# Patient Record
Sex: Female | Born: 1955 | Race: White | Hispanic: No | Marital: Married | Smoking: Never smoker
Health system: Southern US, Community
[De-identification: ages and names within clinical notes are randomized; demographics above are authoritative.]

## PROBLEM LIST (undated history)

## (undated) DIAGNOSIS — Z86718 Personal history of other venous thrombosis and embolism: Secondary | ICD-10-CM

## (undated) HISTORY — PX: GASTRIC BYPASS: SHX52

## (undated) HISTORY — DX: Personal history of other venous thrombosis and embolism: Z86.718

## (undated) HISTORY — PX: ABDOMINAL HYSTERECTOMY: SHX81

## (undated) HISTORY — PX: TONSILLECTOMY: SHX5217

## (undated) HISTORY — PX: HAND TENDON SURGERY: SHX663

## (undated) HISTORY — PX: TUBAL LIGATION: SHX77

---

## 1997-09-06 ENCOUNTER — Emergency Department (HOSPITAL_COMMUNITY): Admission: EM | Admit: 1997-09-06 | Discharge: 1997-09-06 | Payer: Self-pay | Admitting: Emergency Medicine

## 1999-05-21 ENCOUNTER — Ambulatory Visit (HOSPITAL_COMMUNITY): Admission: RE | Admit: 1999-05-21 | Discharge: 1999-05-21 | Payer: Self-pay | Admitting: *Deleted

## 1999-06-29 ENCOUNTER — Ambulatory Visit (HOSPITAL_COMMUNITY): Admission: RE | Admit: 1999-06-29 | Discharge: 1999-06-29 | Payer: Self-pay | Admitting: *Deleted

## 1999-06-29 ENCOUNTER — Encounter (INDEPENDENT_AMBULATORY_CARE_PROVIDER_SITE_OTHER): Payer: Self-pay | Admitting: Specialist

## 1999-07-02 ENCOUNTER — Encounter: Payer: Self-pay | Admitting: *Deleted

## 1999-07-02 ENCOUNTER — Ambulatory Visit (HOSPITAL_COMMUNITY): Admission: RE | Admit: 1999-07-02 | Discharge: 1999-07-02 | Payer: Self-pay | Admitting: *Deleted

## 1999-07-09 ENCOUNTER — Encounter: Payer: Self-pay | Admitting: *Deleted

## 1999-07-09 ENCOUNTER — Ambulatory Visit (HOSPITAL_COMMUNITY): Admission: RE | Admit: 1999-07-09 | Discharge: 1999-07-09 | Payer: Self-pay | Admitting: *Deleted

## 1999-08-16 ENCOUNTER — Encounter: Payer: Self-pay | Admitting: General Surgery

## 1999-08-21 ENCOUNTER — Encounter (INDEPENDENT_AMBULATORY_CARE_PROVIDER_SITE_OTHER): Payer: Self-pay

## 1999-08-21 ENCOUNTER — Observation Stay (HOSPITAL_COMMUNITY): Admission: RE | Admit: 1999-08-21 | Discharge: 1999-08-22 | Payer: Self-pay | Admitting: General Surgery

## 1999-08-23 ENCOUNTER — Emergency Department (HOSPITAL_COMMUNITY): Admission: EM | Admit: 1999-08-23 | Discharge: 1999-08-23 | Payer: Self-pay | Admitting: Emergency Medicine

## 1999-08-23 ENCOUNTER — Encounter: Payer: Self-pay | Admitting: Emergency Medicine

## 1999-09-02 ENCOUNTER — Emergency Department (HOSPITAL_COMMUNITY): Admission: EM | Admit: 1999-09-02 | Discharge: 1999-09-02 | Payer: Self-pay | Admitting: Emergency Medicine

## 1999-09-02 ENCOUNTER — Encounter: Payer: Self-pay | Admitting: Emergency Medicine

## 1999-09-18 ENCOUNTER — Encounter: Payer: Self-pay | Admitting: Emergency Medicine

## 1999-09-18 ENCOUNTER — Emergency Department (HOSPITAL_COMMUNITY): Admission: EM | Admit: 1999-09-18 | Discharge: 1999-09-18 | Payer: Self-pay | Admitting: Emergency Medicine

## 1999-09-27 ENCOUNTER — Encounter: Payer: Self-pay | Admitting: Internal Medicine

## 1999-09-27 ENCOUNTER — Inpatient Hospital Stay (HOSPITAL_COMMUNITY): Admission: EM | Admit: 1999-09-27 | Discharge: 1999-10-05 | Payer: Self-pay | Admitting: Internal Medicine

## 1999-09-29 ENCOUNTER — Encounter: Payer: Self-pay | Admitting: Internal Medicine

## 1999-10-01 ENCOUNTER — Encounter (HOSPITAL_BASED_OUTPATIENT_CLINIC_OR_DEPARTMENT_OTHER): Payer: Self-pay | Admitting: Internal Medicine

## 1999-10-25 ENCOUNTER — Encounter (INDEPENDENT_AMBULATORY_CARE_PROVIDER_SITE_OTHER): Payer: Self-pay | Admitting: Specialist

## 1999-10-25 ENCOUNTER — Ambulatory Visit: Admission: RE | Admit: 1999-10-25 | Discharge: 1999-10-25 | Payer: Self-pay | Admitting: Critical Care Medicine

## 1999-10-29 ENCOUNTER — Encounter: Payer: Self-pay | Admitting: Critical Care Medicine

## 1999-10-29 ENCOUNTER — Ambulatory Visit (HOSPITAL_COMMUNITY): Admission: RE | Admit: 1999-10-29 | Discharge: 1999-10-29 | Payer: Self-pay | Admitting: Critical Care Medicine

## 1999-12-07 ENCOUNTER — Ambulatory Visit: Admission: RE | Admit: 1999-12-07 | Discharge: 1999-12-07 | Payer: Self-pay | Admitting: Internal Medicine

## 1999-12-26 ENCOUNTER — Encounter: Payer: Self-pay | Admitting: Internal Medicine

## 1999-12-26 ENCOUNTER — Inpatient Hospital Stay (HOSPITAL_COMMUNITY): Admission: AD | Admit: 1999-12-26 | Discharge: 2000-01-01 | Payer: Self-pay | Admitting: Internal Medicine

## 2000-01-22 ENCOUNTER — Emergency Department (HOSPITAL_COMMUNITY): Admission: EM | Admit: 2000-01-22 | Discharge: 2000-01-22 | Payer: Self-pay | Admitting: Emergency Medicine

## 2000-02-13 ENCOUNTER — Encounter: Payer: Self-pay | Admitting: Emergency Medicine

## 2000-02-13 ENCOUNTER — Emergency Department (HOSPITAL_COMMUNITY): Admission: EM | Admit: 2000-02-13 | Discharge: 2000-02-13 | Payer: Self-pay | Admitting: Emergency Medicine

## 2000-07-01 ENCOUNTER — Encounter: Admission: RE | Admit: 2000-07-01 | Discharge: 2000-07-01 | Payer: Self-pay | Admitting: Internal Medicine

## 2000-07-01 ENCOUNTER — Encounter: Payer: Self-pay | Admitting: Internal Medicine

## 2000-08-26 ENCOUNTER — Emergency Department (HOSPITAL_COMMUNITY): Admission: EM | Admit: 2000-08-26 | Discharge: 2000-08-26 | Payer: Self-pay | Admitting: Emergency Medicine

## 2000-08-26 ENCOUNTER — Encounter: Payer: Self-pay | Admitting: Emergency Medicine

## 2001-04-30 ENCOUNTER — Emergency Department (HOSPITAL_COMMUNITY): Admission: EM | Admit: 2001-04-30 | Discharge: 2001-05-01 | Payer: Self-pay | Admitting: Emergency Medicine

## 2001-05-01 ENCOUNTER — Ambulatory Visit: Admission: RE | Admit: 2001-05-01 | Discharge: 2001-05-01 | Payer: Self-pay | Admitting: *Deleted

## 2001-05-01 ENCOUNTER — Encounter: Payer: Self-pay | Admitting: Emergency Medicine

## 2001-05-16 ENCOUNTER — Emergency Department (HOSPITAL_COMMUNITY): Admission: EM | Admit: 2001-05-16 | Discharge: 2001-05-16 | Payer: Self-pay | Admitting: Emergency Medicine

## 2002-01-24 ENCOUNTER — Encounter: Payer: Self-pay | Admitting: Emergency Medicine

## 2002-01-24 ENCOUNTER — Emergency Department (HOSPITAL_COMMUNITY): Admission: AC | Admit: 2002-01-24 | Discharge: 2002-01-24 | Payer: Self-pay

## 2002-07-02 ENCOUNTER — Encounter: Payer: Self-pay | Admitting: Emergency Medicine

## 2002-07-02 ENCOUNTER — Emergency Department (HOSPITAL_COMMUNITY): Admission: EM | Admit: 2002-07-02 | Discharge: 2002-07-03 | Payer: Self-pay | Admitting: Emergency Medicine

## 2002-07-02 ENCOUNTER — Emergency Department (HOSPITAL_COMMUNITY): Admission: EM | Admit: 2002-07-02 | Discharge: 2002-07-02 | Payer: Self-pay | Admitting: Emergency Medicine

## 2002-07-03 ENCOUNTER — Encounter: Payer: Self-pay | Admitting: Emergency Medicine

## 2003-06-12 ENCOUNTER — Emergency Department (HOSPITAL_COMMUNITY): Admission: EM | Admit: 2003-06-12 | Discharge: 2003-06-13 | Payer: Self-pay | Admitting: Emergency Medicine

## 2004-04-27 ENCOUNTER — Ambulatory Visit (HOSPITAL_COMMUNITY): Admission: RE | Admit: 2004-04-27 | Discharge: 2004-04-27 | Payer: Self-pay

## 2004-05-02 ENCOUNTER — Ambulatory Visit: Payer: Self-pay | Admitting: Internal Medicine

## 2004-05-09 ENCOUNTER — Ambulatory Visit: Payer: Self-pay | Admitting: Internal Medicine

## 2004-05-17 ENCOUNTER — Ambulatory Visit: Payer: Self-pay | Admitting: Internal Medicine

## 2004-10-19 ENCOUNTER — Encounter: Admission: RE | Admit: 2004-10-19 | Discharge: 2004-10-19 | Payer: Self-pay | Admitting: Internal Medicine

## 2005-03-28 ENCOUNTER — Emergency Department (HOSPITAL_COMMUNITY): Admission: EM | Admit: 2005-03-28 | Discharge: 2005-03-28 | Payer: Self-pay | Admitting: Nurse Practitioner

## 2005-03-29 ENCOUNTER — Ambulatory Visit (HOSPITAL_COMMUNITY): Admission: RE | Admit: 2005-03-29 | Discharge: 2005-03-29 | Payer: Self-pay | Admitting: Internal Medicine

## 2005-04-08 ENCOUNTER — Encounter: Admission: RE | Admit: 2005-04-08 | Discharge: 2005-04-08 | Payer: Self-pay | Admitting: Internal Medicine

## 2005-04-25 ENCOUNTER — Encounter: Admission: RE | Admit: 2005-04-25 | Discharge: 2005-04-25 | Payer: Self-pay | Admitting: Internal Medicine

## 2005-05-05 ENCOUNTER — Emergency Department (HOSPITAL_COMMUNITY): Admission: EM | Admit: 2005-05-05 | Discharge: 2005-05-05 | Payer: Self-pay | Admitting: Family Medicine

## 2005-05-15 ENCOUNTER — Encounter: Admission: RE | Admit: 2005-05-15 | Discharge: 2005-06-05 | Payer: Self-pay | Admitting: Internal Medicine

## 2005-08-10 ENCOUNTER — Encounter: Admission: RE | Admit: 2005-08-10 | Discharge: 2005-08-10 | Payer: Self-pay | Admitting: Orthopedic Surgery

## 2005-12-06 ENCOUNTER — Emergency Department (HOSPITAL_COMMUNITY): Admission: EM | Admit: 2005-12-06 | Discharge: 2005-12-06 | Payer: Self-pay | Admitting: Emergency Medicine

## 2006-01-23 ENCOUNTER — Emergency Department (HOSPITAL_COMMUNITY): Admission: EM | Admit: 2006-01-23 | Discharge: 2006-01-23 | Payer: Self-pay | Admitting: Emergency Medicine

## 2006-01-23 ENCOUNTER — Emergency Department (HOSPITAL_COMMUNITY): Admission: EM | Admit: 2006-01-23 | Discharge: 2006-01-24 | Payer: Self-pay | Admitting: Emergency Medicine

## 2006-01-29 ENCOUNTER — Encounter: Admission: RE | Admit: 2006-01-29 | Discharge: 2006-01-29 | Payer: Self-pay | Admitting: Internal Medicine

## 2006-05-09 ENCOUNTER — Encounter: Admission: RE | Admit: 2006-05-09 | Discharge: 2006-05-09 | Payer: Self-pay | Admitting: Internal Medicine

## 2006-05-16 ENCOUNTER — Emergency Department (HOSPITAL_COMMUNITY): Admission: EM | Admit: 2006-05-16 | Discharge: 2006-05-17 | Payer: Self-pay | Admitting: Emergency Medicine

## 2006-05-23 ENCOUNTER — Emergency Department (HOSPITAL_COMMUNITY): Admission: EM | Admit: 2006-05-23 | Discharge: 2006-05-23 | Payer: Self-pay | Admitting: Emergency Medicine

## 2006-05-30 ENCOUNTER — Encounter: Admission: RE | Admit: 2006-05-30 | Discharge: 2006-05-30 | Payer: Self-pay | Admitting: Internal Medicine

## 2006-06-20 ENCOUNTER — Other Ambulatory Visit: Admission: RE | Admit: 2006-06-20 | Discharge: 2006-06-20 | Payer: Self-pay | Admitting: Interventional Radiology

## 2006-06-20 ENCOUNTER — Encounter (INDEPENDENT_AMBULATORY_CARE_PROVIDER_SITE_OTHER): Payer: Self-pay | Admitting: Physician Assistant

## 2006-06-20 ENCOUNTER — Encounter: Admission: RE | Admit: 2006-06-20 | Discharge: 2006-06-20 | Payer: Self-pay | Admitting: Internal Medicine

## 2006-11-17 ENCOUNTER — Emergency Department (HOSPITAL_COMMUNITY): Admission: EM | Admit: 2006-11-17 | Discharge: 2006-11-18 | Payer: Self-pay | Admitting: Emergency Medicine

## 2007-03-12 ENCOUNTER — Emergency Department (HOSPITAL_COMMUNITY): Admission: EM | Admit: 2007-03-12 | Discharge: 2007-03-13 | Payer: Self-pay | Admitting: Emergency Medicine

## 2007-08-09 ENCOUNTER — Emergency Department (HOSPITAL_COMMUNITY): Admission: EM | Admit: 2007-08-09 | Discharge: 2007-08-09 | Payer: Self-pay | Admitting: Emergency Medicine

## 2007-08-18 ENCOUNTER — Emergency Department (HOSPITAL_COMMUNITY): Admission: EM | Admit: 2007-08-18 | Discharge: 2007-08-19 | Payer: Self-pay | Admitting: Emergency Medicine

## 2007-08-20 ENCOUNTER — Encounter: Admission: RE | Admit: 2007-08-20 | Discharge: 2007-08-20 | Payer: Self-pay | Admitting: Internal Medicine

## 2007-08-21 ENCOUNTER — Ambulatory Visit (HOSPITAL_BASED_OUTPATIENT_CLINIC_OR_DEPARTMENT_OTHER): Admission: RE | Admit: 2007-08-21 | Discharge: 2007-08-21 | Payer: Self-pay | Admitting: Orthopedic Surgery

## 2007-09-12 ENCOUNTER — Emergency Department (HOSPITAL_COMMUNITY): Admission: EM | Admit: 2007-09-12 | Discharge: 2007-09-12 | Payer: Self-pay | Admitting: Emergency Medicine

## 2007-09-22 ENCOUNTER — Emergency Department (HOSPITAL_COMMUNITY): Admission: EM | Admit: 2007-09-22 | Discharge: 2007-09-23 | Payer: Self-pay | Admitting: Family Medicine

## 2007-09-23 ENCOUNTER — Ambulatory Visit (HOSPITAL_COMMUNITY): Admission: RE | Admit: 2007-09-23 | Discharge: 2007-09-23 | Payer: Self-pay | Admitting: Emergency Medicine

## 2007-10-14 IMAGING — US US SOFT TISSUE HEAD/NECK
1 series · 13 of 25 positions shown · non-contrast
Comparison: 05/23/2006

CLINICAL DATA: Thyroid nodule seen on CT scan.

THYROID ULTRASOUND
TECHNIQUE: Ultrasound examination of the thyroid gland and adjacent soft tissue
structures was performed.

[Series 1: us soft tissue head/neck · 0.09mm/px · 13 of 45 slices shown]
[im 1/45]
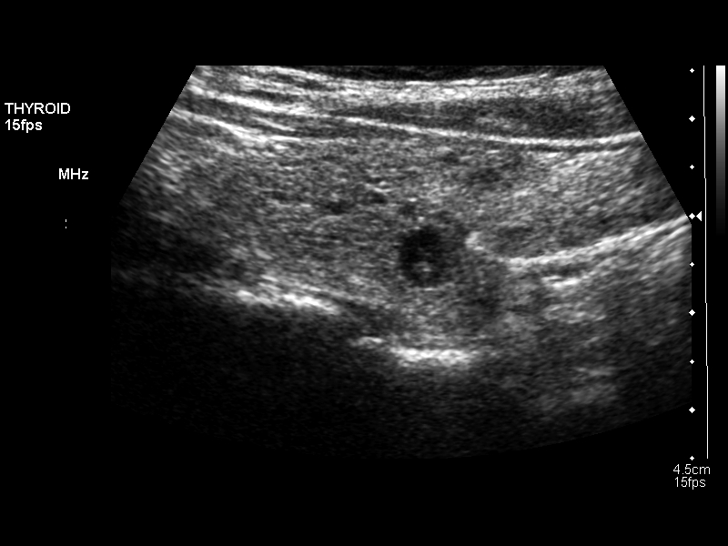
[im 4/45]
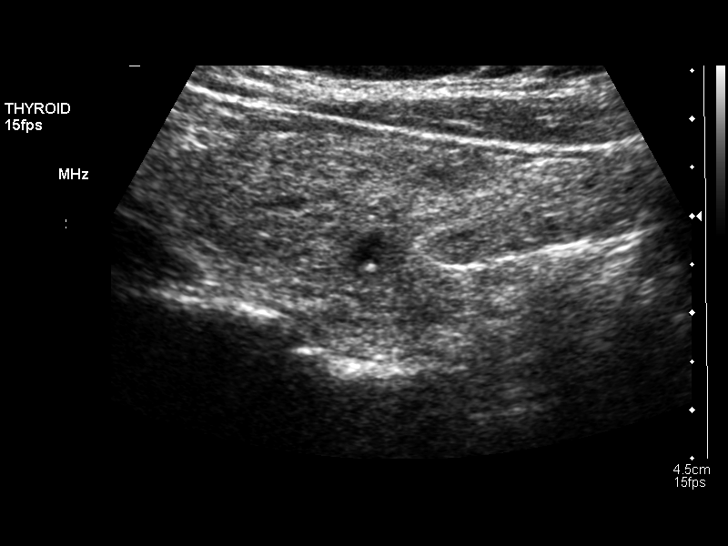
[im 8/45]
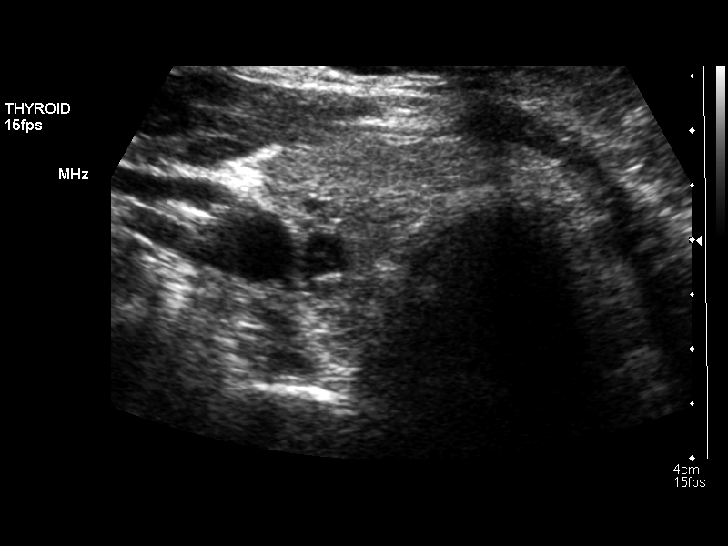
[im 12/45]
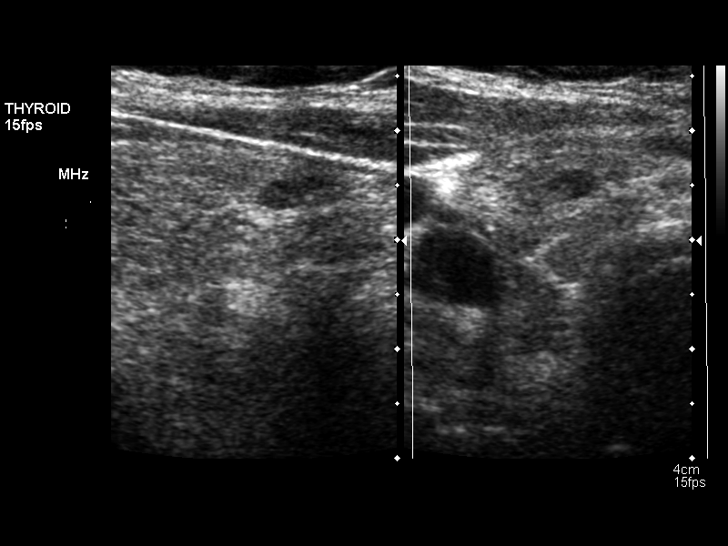
[im 15/45]
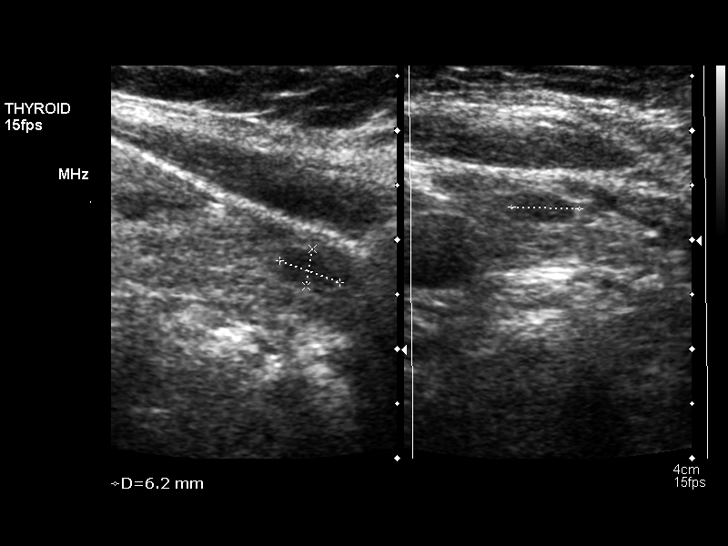
[im 19/45]
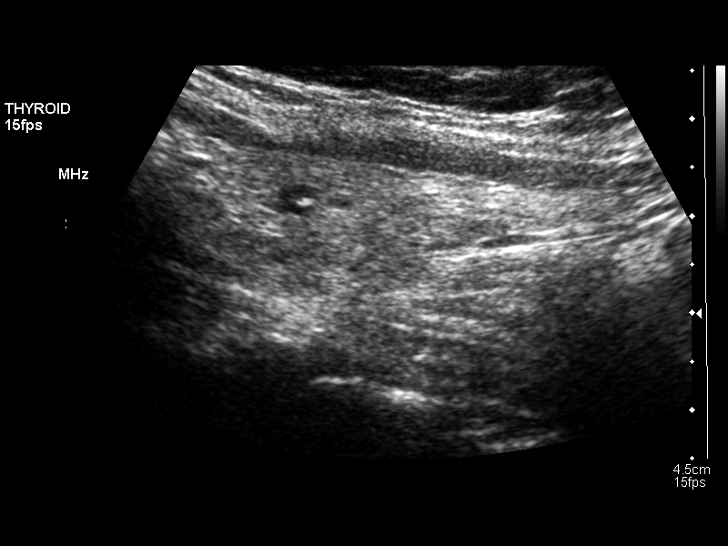
[im 23/45]
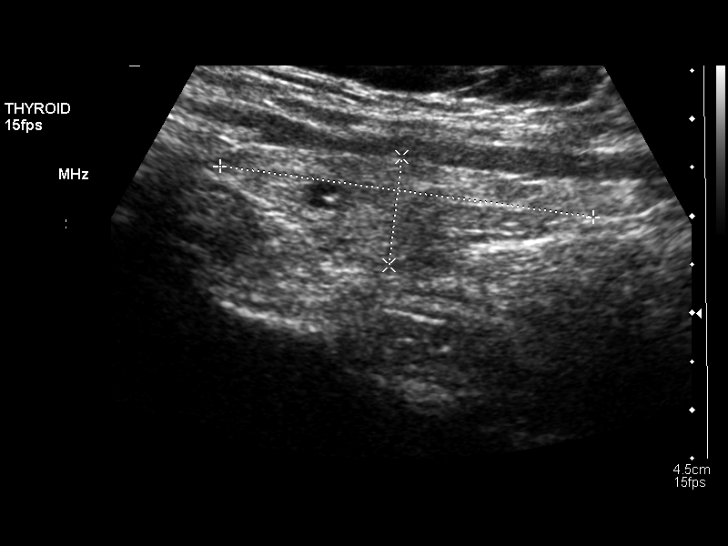
[im 26/45]
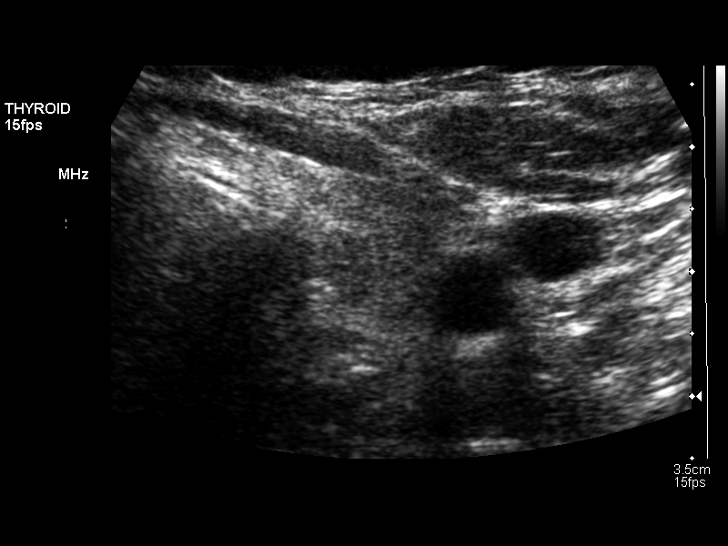
[im 30/45]
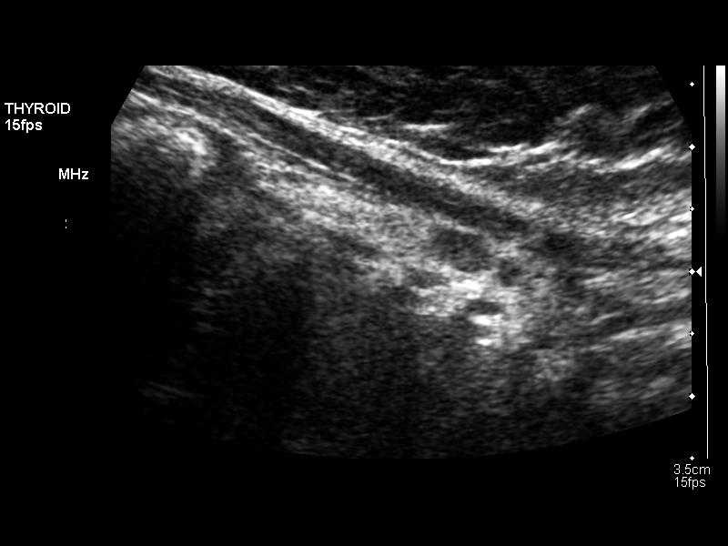
[im 34/45]
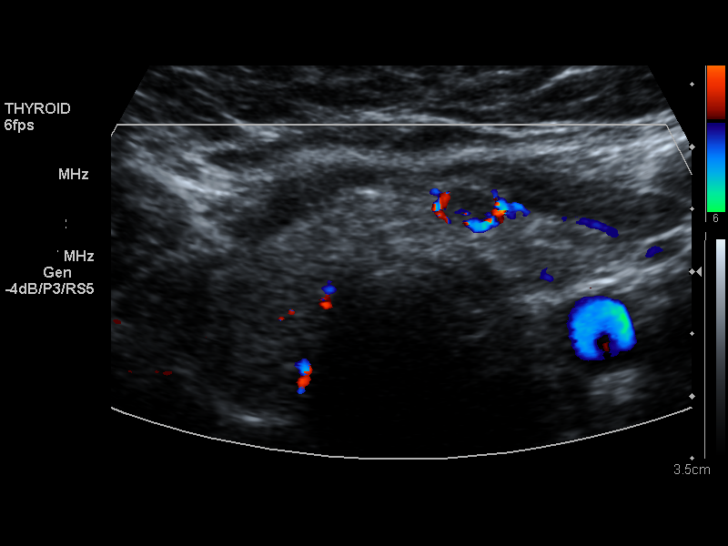
[im 37/45]
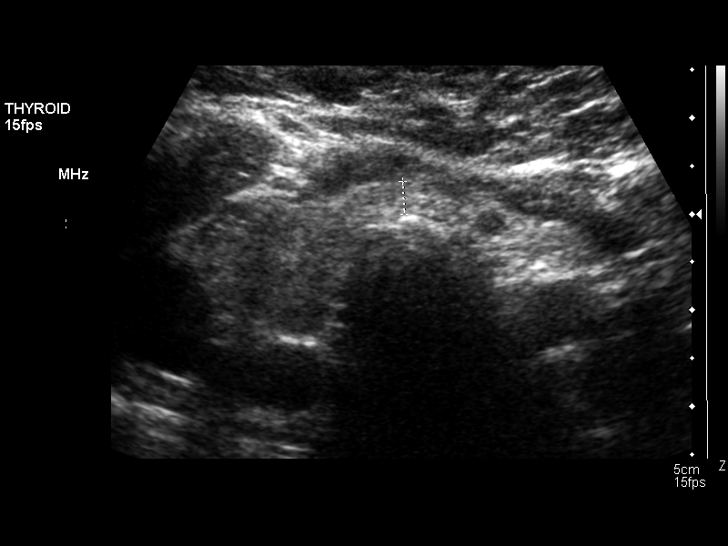
[im 41/45]
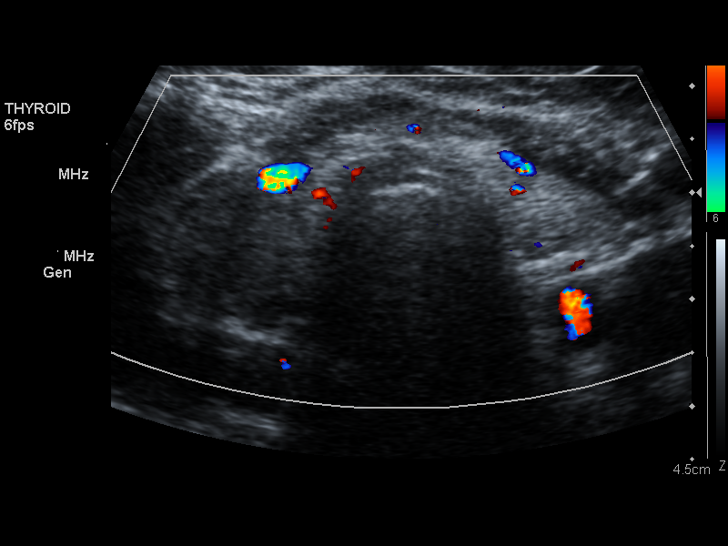
[im 45/45]
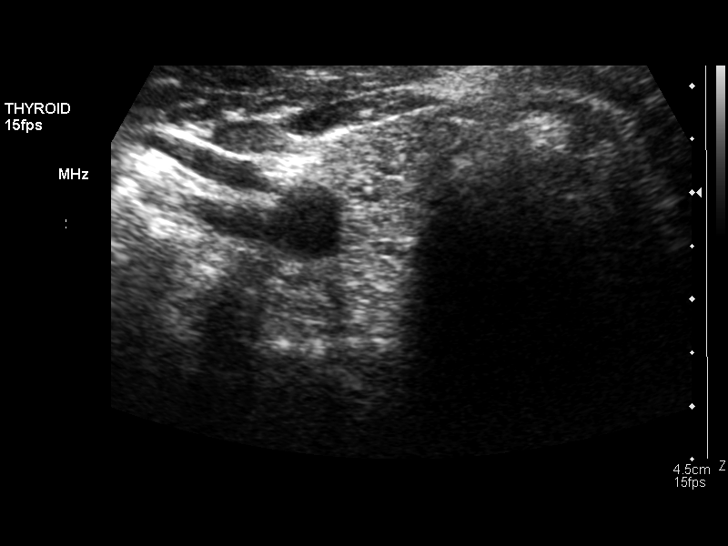

[13 of 25 positions shown; findings below may reference images not displayed]

FINDINGS: Right thyroid lobe measures 5.3 x 2 x 1 x 1.6 cm in the left lobe
measures 4.3 x 1.4 x 1.3 cm. The thyroid isthmus measures 3 mm in thickness.

Thyroid echotexture is mildly heterogeneous. Scattered small thyroid lesions are
present, with the largest measuring 8 x 6 x 5 mm in the right lobe midportion.
This lesion has cystic elements, and could possibly have faint internal
microcalcification.

Also in the right lobe is a 6 x 4 x 6 mm solid-appearing lesion in the inferior
pole, as well as an adjacent 4 x 3 x 5 mm lesion.

In the left thyroid lobe, there are 2 lesions adjacent to the isthmus, one of
which is partially cystic and measures through 3 x 2 x 3 mm. Adjacent solid
nodule measures 5 x 4 x 5 mm.

A cystic lesion with internal echoes in the upper pole of the left lobe of the
thyroid measures 4 x 3 x 4 mm.

IMPRESSION

1. Multiple small thyroid nodules are identified.  The largest nodule has
several faint internal hyperechogenicities, possibly representing
microcalcifications. Such microcalcifications can be associated with thyroid
cancer. Fine needle aspiration of this lesion in the right thyroid lobe is
recommended.

## 2007-11-17 ENCOUNTER — Emergency Department (HOSPITAL_COMMUNITY): Admission: EM | Admit: 2007-11-17 | Discharge: 2007-11-17 | Payer: Self-pay | Admitting: *Deleted

## 2008-01-24 ENCOUNTER — Emergency Department (HOSPITAL_COMMUNITY): Admission: EM | Admit: 2008-01-24 | Discharge: 2008-01-24 | Payer: Self-pay | Admitting: Emergency Medicine

## 2008-04-25 ENCOUNTER — Emergency Department (HOSPITAL_COMMUNITY): Admission: EM | Admit: 2008-04-25 | Discharge: 2008-04-26 | Payer: Self-pay | Admitting: Emergency Medicine

## 2008-05-30 ENCOUNTER — Emergency Department (HOSPITAL_COMMUNITY): Admission: EM | Admit: 2008-05-30 | Discharge: 2008-05-30 | Payer: Self-pay | Admitting: Emergency Medicine

## 2008-10-19 ENCOUNTER — Emergency Department (HOSPITAL_COMMUNITY): Admission: EM | Admit: 2008-10-19 | Discharge: 2008-10-19 | Payer: Self-pay | Admitting: Emergency Medicine

## 2008-10-20 ENCOUNTER — Ambulatory Visit (HOSPITAL_COMMUNITY): Admission: RE | Admit: 2008-10-20 | Discharge: 2008-10-20 | Payer: Self-pay | Admitting: Emergency Medicine

## 2010-02-11 ENCOUNTER — Encounter: Payer: Self-pay | Admitting: Internal Medicine

## 2010-03-06 IMAGING — US US EXTREM LOW VENOUS*L*
1 series · 14 of 24 positions shown · non-contrast
Comparison: None

CLINICAL DATA: History given of left leg pain and swelling with
previous history of deep venous thrombosis.



[Series 1: unknown · 14 of 31 slices shown]
[im 1/31]
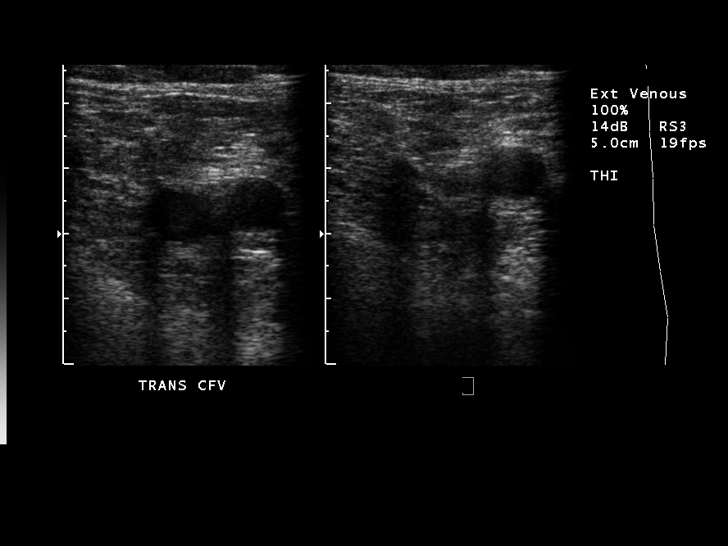
[im 3/31]
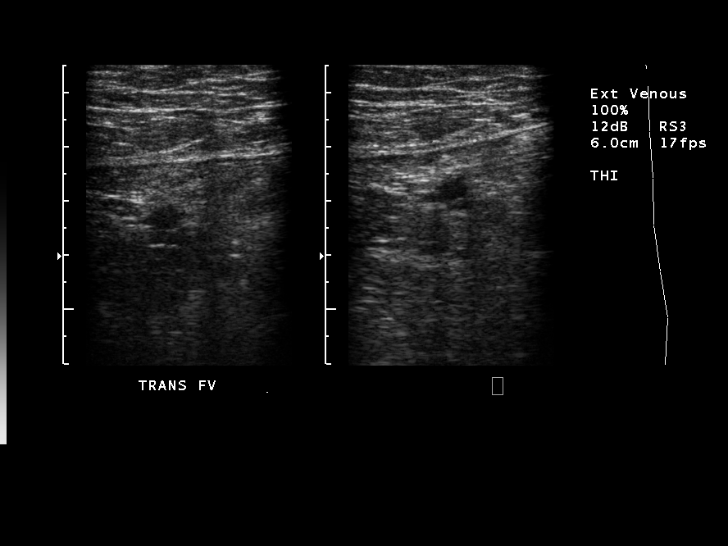
[im 6/31]
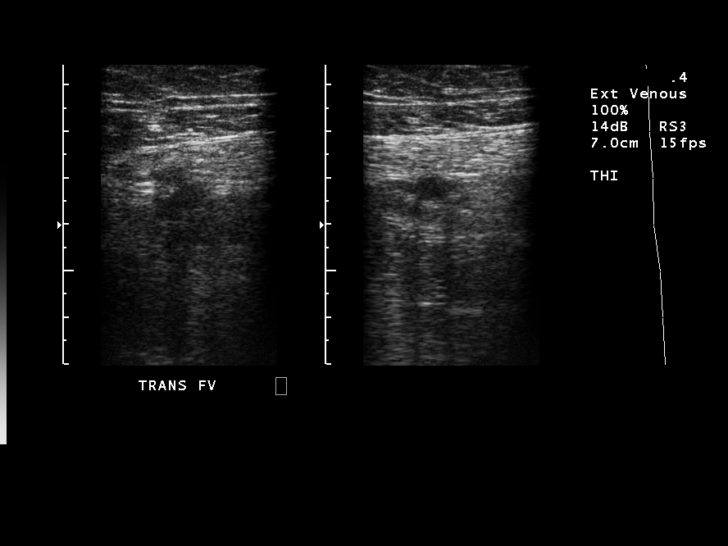
[im 8/31]
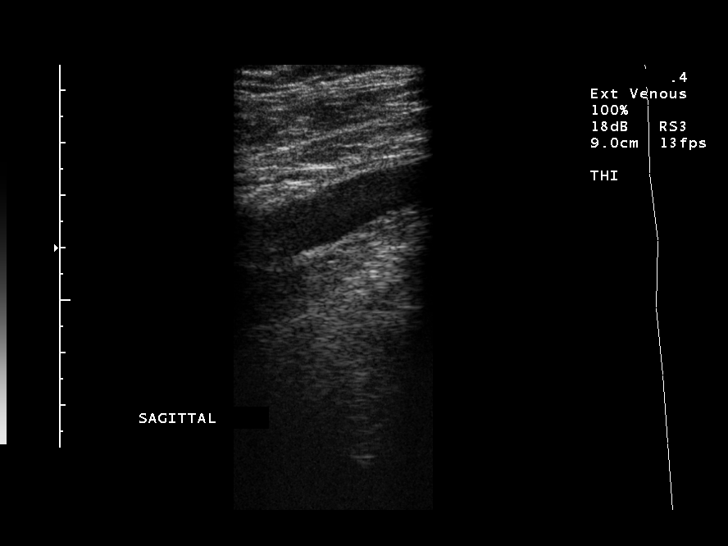
[im 10/31]
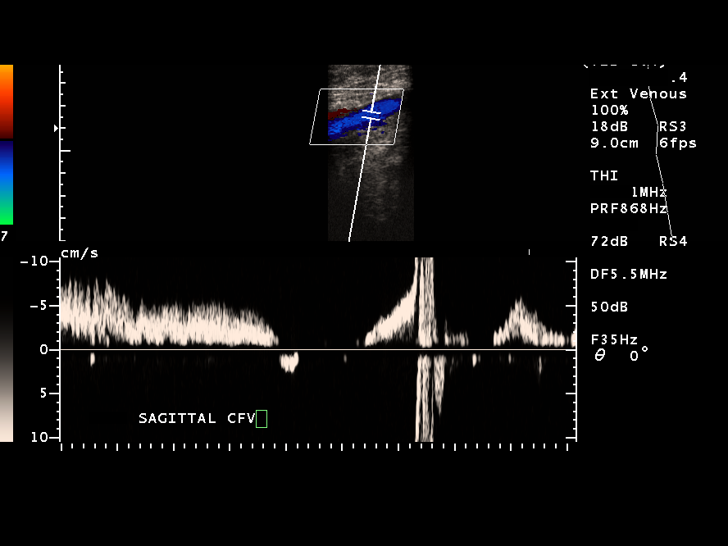
[im 12/31]
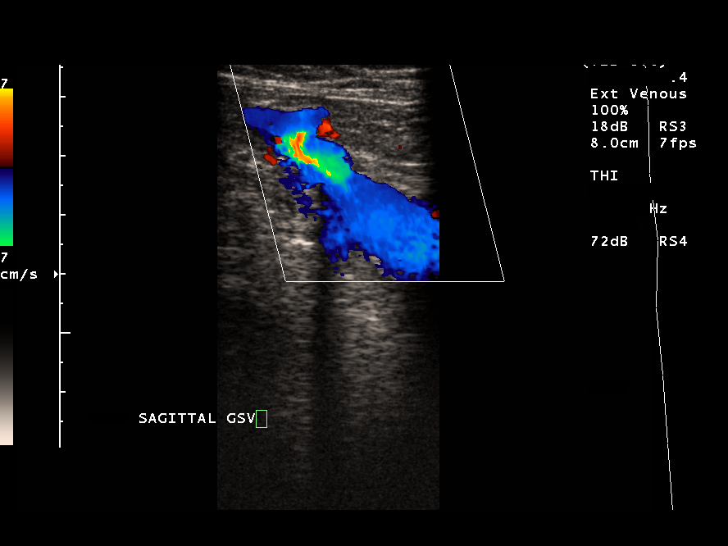
[im 15/31]
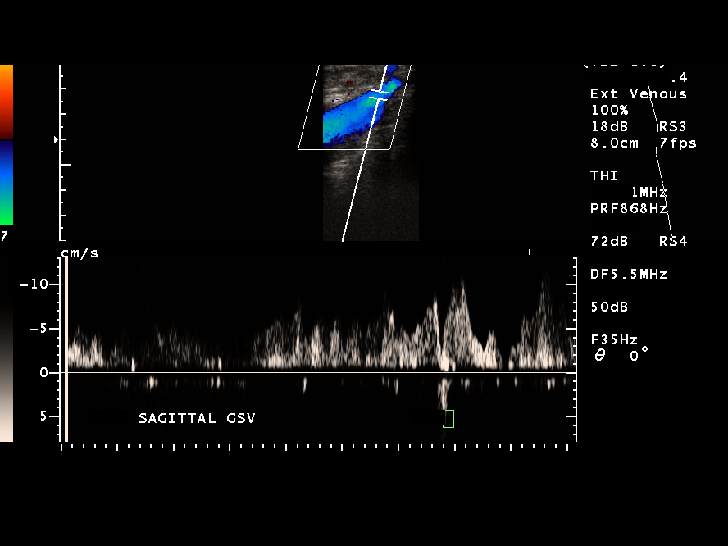
[im 16/31]
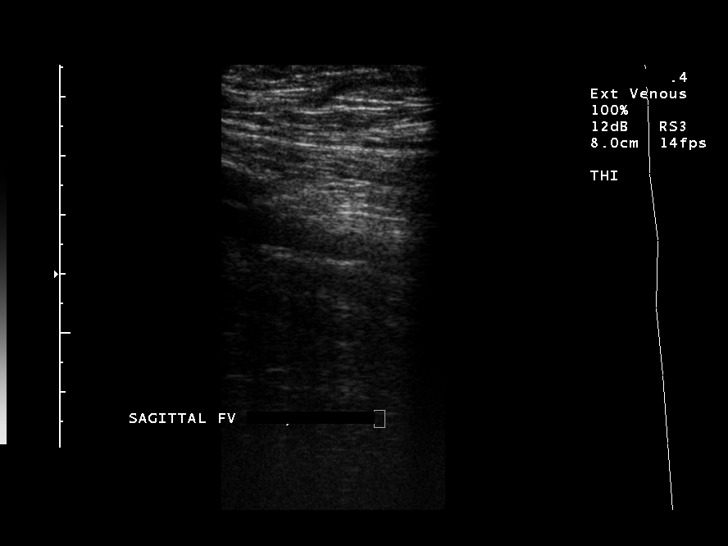
[im 19/31]
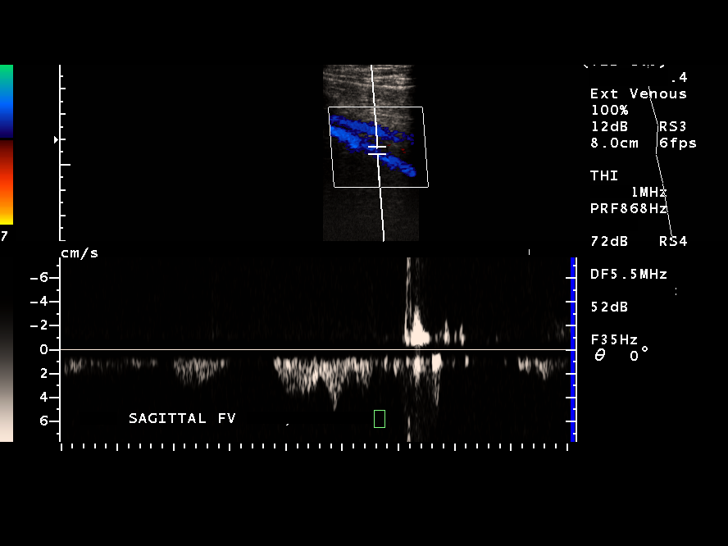
[im 21/31]
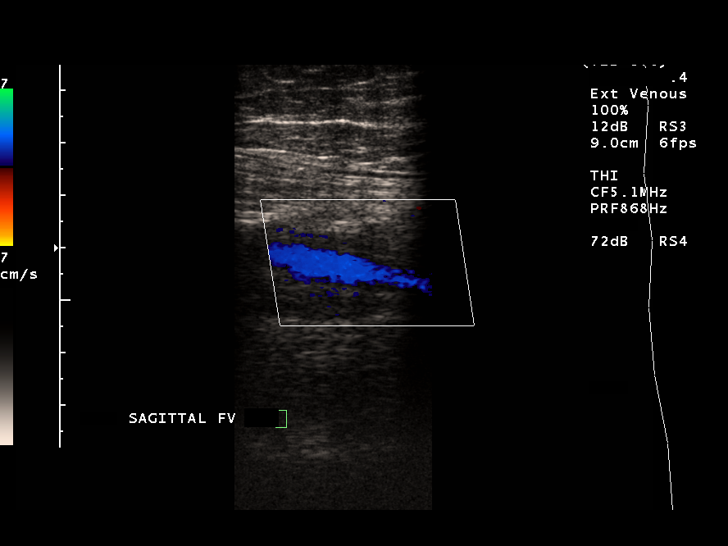
[im 24/31]
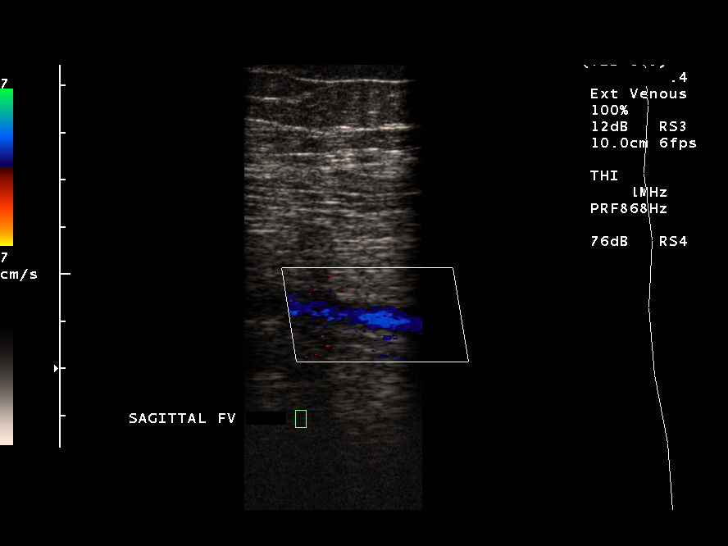
[im 25/31]
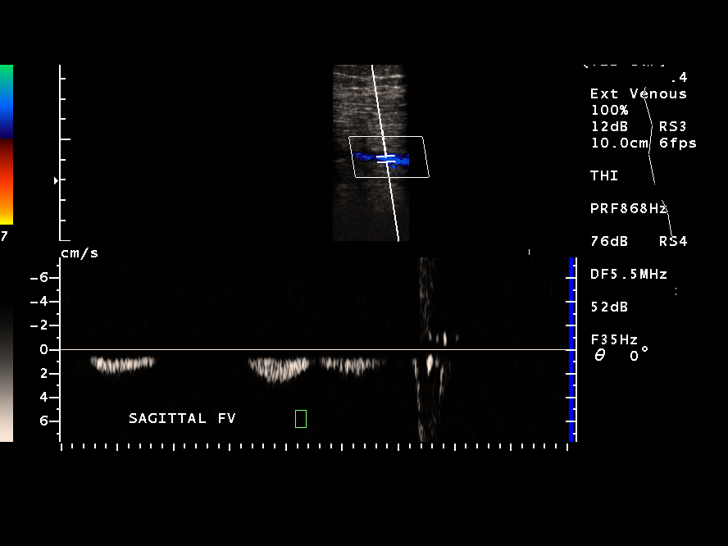
[im 28/31]
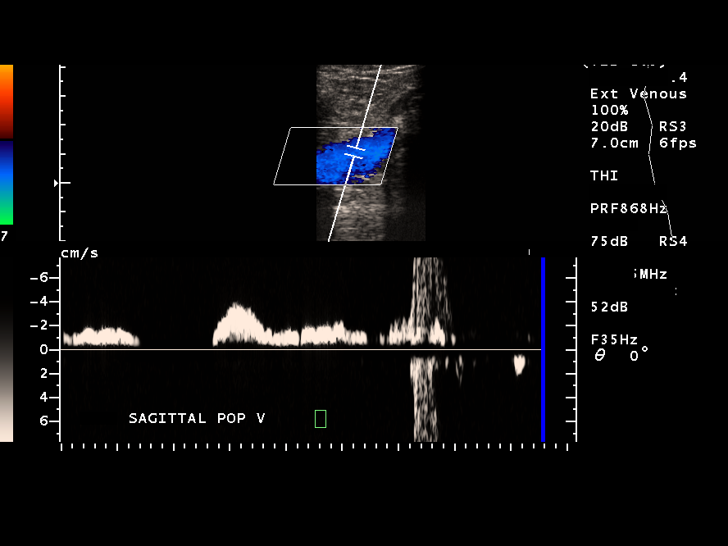
[im 31/31]
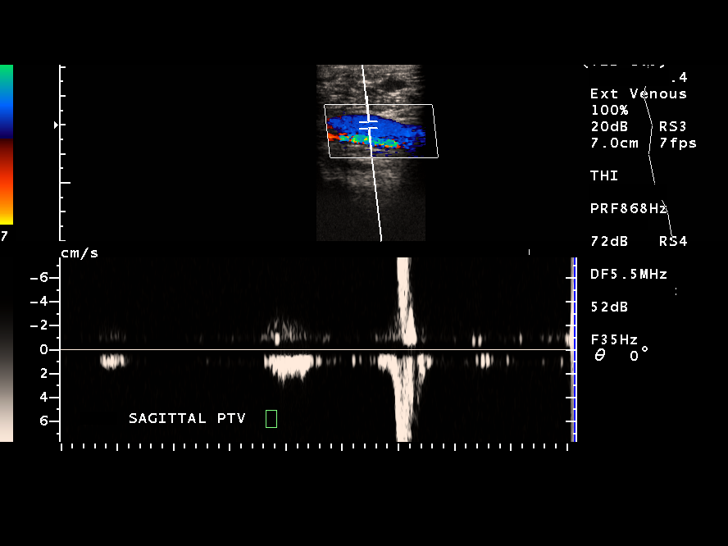

[14 of 24 positions shown; findings below may reference images not displayed]

FINDINGS: Normal compressibility of  the left common femoral,
superficial femoral, and popliteal veins is demonstrated, as well
as the visualized proximal calf veins.  No filling defects to
suggest DVT on grayscale or color Doppler imaging.  Doppler
waveforms show normal direction of venous flow, normal respiratory
phasicity and response to augmentation.
IMPRESSION: No evidence of  left lower extremity deep vein thrombosis.

## 2010-04-27 LAB — PROTIME-INR
INR: 2.2 — ABNORMAL HIGH (ref 0.00–1.49)
Prothrombin Time: 23.9 seconds — ABNORMAL HIGH (ref 11.6–15.2)

## 2010-04-27 LAB — DIFFERENTIAL
Basophils Absolute: 0 10*3/uL (ref 0.0–0.1)
Lymphocytes Relative: 16 % (ref 12–46)
Neutro Abs: 5.2 10*3/uL (ref 1.7–7.7)
Neutrophils Relative %: 76 % (ref 43–77)

## 2010-04-27 LAB — BASIC METABOLIC PANEL
BUN: 12 mg/dL (ref 6–23)
Calcium: 9.2 mg/dL (ref 8.4–10.5)
GFR calc non Af Amer: >60 mL/min (ref >60)
Glucose, Bld: 107 mg/dL — ABNORMAL HIGH (ref 70–99)

## 2010-04-27 LAB — CBC
Platelets: 308 10*3/uL (ref 150–400)
RDW: 16.7 % — ABNORMAL HIGH (ref 11.5–15.5)
WBC: 6.9 10*3/uL (ref 4.0–10.5)

## 2010-05-01 LAB — POCT I-STAT, CHEM 8
Chloride: 101 mEq/L (ref 96–112)
Creatinine, Ser: 1 mg/dL (ref 0.4–1.2)
Glucose, Bld: 101 mg/dL — ABNORMAL HIGH (ref 70–99)
Potassium: 3.8 mEq/L (ref 3.5–5.1)

## 2010-05-01 LAB — CBC
MCHC: 33.2 g/dL (ref 30.0–36.0)
RBC: 4.73 MIL/uL (ref 3.87–5.11)
WBC: 5.7 10*3/uL (ref 4.0–10.5)

## 2010-05-01 LAB — URINALYSIS, ROUTINE W REFLEX MICROSCOPIC
Nitrite: NEGATIVE
Specific Gravity, Urine: 1.011 (ref 1.005–1.030)
Urobilinogen, UA: 0.2 mg/dL (ref 0.0–1.0)

## 2010-05-01 LAB — POCT CARDIAC MARKERS
CKMB, poc: <1 ng/mL — ABNORMAL LOW (ref 1.0–8.0)
Troponin i, poc: <0.05 ng/mL (ref 0.00–0.09)

## 2010-05-01 LAB — RAPID URINE DRUG SCREEN, HOSP PERFORMED
Barbiturates: NOT DETECTED
Cocaine: NOT DETECTED
Opiates: NOT DETECTED

## 2010-05-01 LAB — CK TOTAL AND CKMB (NOT AT ARMC)
CK, MB: 1.8 ng/mL (ref 0.3–4.0)
Relative Index: 1.2 (ref 0.0–2.5)
Total CK: 135 U/L (ref 7–177)

## 2010-05-01 LAB — BASIC METABOLIC PANEL
CO2: 34 mEq/L — ABNORMAL HIGH (ref 19–32)
Calcium: 9 mg/dL (ref 8.4–10.5)
Creatinine, Ser: 0.7 mg/dL (ref 0.4–1.2)
GFR calc Af Amer: >60 mL/min (ref >60)
GFR calc non Af Amer: >60 mL/min (ref >60)

## 2010-05-01 LAB — DIFFERENTIAL
Basophils Absolute: 0 10*3/uL (ref 0.0–0.1)
Basophils Relative: 1 % (ref 0–1)
Eosinophils Relative: 2 % (ref 0–5)
Monocytes Absolute: 0.6 10*3/uL (ref 0.1–1.0)
Neutro Abs: 2.5 10*3/uL (ref 1.7–7.7)

## 2010-05-01 LAB — TROPONIN I: Troponin I: 0.02 ng/mL (ref 0.00–0.06)

## 2010-05-01 LAB — APTT: aPTT: 24 seconds (ref 24–37)

## 2010-05-01 LAB — BRAIN NATRIURETIC PEPTIDE: Pro B Natriuretic peptide (BNP): 59 pg/mL (ref 0.0–100.0)

## 2010-06-05 NOTE — Op Note (Signed)
NAMEJESSIA, Judith Stokes                ACCOUNT NO.:  [id number]   MEDICAL RECORD NO.:  [id number]          PATIENT TYPE:  AMB   LOCATION:  DSC                          FACILITY:  [facility]   PHYSICIAN:  Katy Fitch. Sypher, M.D. DATE OF BIRTH:  January 16, 1956   DATE OF PROCEDURE:  08/21/2007  DATE OF DISCHARGE:                               OPERATIVE REPORT   PREOPERATIVE DIAGNOSIS:  Entrapment neuropathy, median nerve and right  carpal tunnel.   POSTOPERATIVE DIAGNOSIS:  Entrapment neuropathy, median nerve and right  carpal tunnel.   OPERATIONS:  Release of right transcarpal ligament.   OPERATIONS:  Katy Fitch. Sypher, MD.   ASSISTANT:  Marveen Reeks. Dasnoit, PA-C.   ANESTHESIA:  General by LMA, supervising anesthesiologist is Dr. Krista Blue.   INDICATIONS:  Judith Stokes is a 55 year old woman referred through the  courtesy of Dr. Georgianne Fick for evaluation and management of  right hand numbness.  Dr. Nicholos Johns had referred Judith Stokes for  evaluation and management by Dr. Edgar Frisk, [facility] in  March of 2009.  Clinical examination and electrodiagnostic studies at  that time documented a moderate right carpal tunnel syndrome.   Judith Stokes has not responded to nonoperative measures.  Therefore, she  was referred for an opportunity to get the consult.  She has multiple  background medical problems and was on Coumadin anticoagulation due to  history of pulmonary embolus.   Due to failed response to nonoperative measures, she is now brought to  the operating room at this time for release of right transcarpal  ligament.   Preoperatively, she was weaned from her Coumadin with a last dose on  August 15, 2007.  Her INR on August 20, 2007, was 1.7.  She should be in  acceptable range today.   After informed consent and anesthesia consultation by Dr. Krista Blue, she  was brought to the operating room at this time anticipating right carpal  tunnel release.   PROCEDURE:  [name] was  brought to the operating room and placed in  supine position on the operating table.   Following induction of general anesthesia by LMA technique, the right  arm was prepped with Betadine soap solution and sterilely draped.  A  pneumatic tourniquet was applied to the proximal right brachium.   Following exsanguination of the right arm with Esmarch bandage, an  arterial tourniquet was inflated to 220 mmHg.  The procedure commenced  with a short incision in line of the ring finger of the palm.  Subcutaneous tissues were carefully divided revealing the palmar fascia.  This split longitudinally to reveal common sensory branch of the median  nerve.   These were followed back to transverse carpal ligament, which was gently  isolated from median nerve.  The ligament was then released along its  ulnar border extending into distal forearm.   This widely opened carpal canal.  No masses or other predicaments were  noted.  The wound was then repaired with intradermal 3-0 Prolene and  Steri-Strips.  A 2% lidocaine was infiltrated as postop analgesia.  [name] was placed in compressive dressing and  the volar plaster splint.   She is discharged to the care of her family.  We will see her back  follow up in 1 week.      Katy Fitch Sypher, M.D.  Electronically Signed     RVS/MEDQ  D:  08/21/2007  T:  08/22/2007  Job:  [id number]

## 2010-06-08 NOTE — Op Note (Signed)
[facility]  Patient:    Judith Stokes, Judith Stokes                MRN: [id number] Proc. Date: 08/21/99 Adm. Date:  [id number] Disc. Date: [id number] Attending:  Carson Myrtle CC:         Judith Stokes, M.D.  Sabino Gasser, M.D.   Operative Report  PREOPERATIVE DIAGNOSIS:  Chronic cholecystolithiasis.  POSTOPERATIVE DIAGNOSIS:  Chronic cholecystolithiasis.  OPERATION PERFORMED:  Laparoscopic cholecystectomy.  SURGEON:  Timothy E. Earlene Plater, M.D.  ASSISTANT:  Anselm Pancoast. Zachery Dakins, M.D.  ANESTHESIA:  CRNA supervised Fortune.  INDICATIONS FOR PROCEDURE:  Ms. Brandy is 60.  She has had a previous open abdominal stapling procedure for obesity.  She however, remains obese.  She has an undiagnosed anemia, normal upper and lower endoscopy, known gallstones and because of persistent symptoms she wishes to proceed.  Hematologic work-up was strongly recommended.  DESCRIPTION OF PROCEDURE:  The patient was brought to the operating room. General endotracheal anesthesia administered.  The patient was supine. Abdomen was prepped and draped in the usual fashion.  Marcaine 0.5% with epinephrine was used at each puncture site.  The infraumbilical incision was made horizontally, the fascia identified and opened vertically.  The abdomen entered without complication.  Hasson catheter placed, tied in place and the abdomen insufflated.  The peritoneoscopy was unremarkable except for midline adhesions in the upper abdomen.  I went slightly to the right of the midline to place a 10 mm trocar in the midepigastrium, two 5 mm trocars placed under direct vision in the right upper quadrant.  The gallbladder was small, appeared chronically inflamed and thickened.  There were no adhesions.  The remainder of the abdomen appeared normal as best could be seen.  The gallbladder was grasped and placed on tension.  Careful dissection at the base of the gallbladder revealed a  normal-appearing cystic duct entering the gallbladder.  This was dissected free, triply clipped and divided.  A cystic artery beneath that was likewise dissected free, triply clipped and divided. The gallbladder was then removed from the gallbladder bed, a couple of additional clips applied to the peritoneum.  No further vascular or bowel structures seen.  The gallbladder bed was clean and dry.  Irrigant was clear. The gallbladder was removed through the infraumbilical incision which was then tied closed.  Inspection was negative and all irrigant, CO2, trocars and instruments removed from the abdomen.  Trocar sites closed with 4-0 Monocryl. Steri-Strips applied.  Counts correct.  The patient tolerated the procedure well, was awakened and taken to the recovery room in good condition. DD:  08/21/99 TD:  08/22/99 Job: [id number]

## 2010-06-08 NOTE — Consult Note (Signed)
[facility]  Patient:    Judith Stokes, Judith Stokes                MRN: [id number] Adm. Date:  [id number] Attending:  Kimber Relic CC:         Lenon Curt. Cassell Clement, M.D.   Consultation Report  REASON FOR CONSULTATION:  Saddle embolus with suprarenal IVC clot.  HISTORY:  This is a 55 year old woman who was found on CAT scan today to have evidence of a saddle embolus and also clot within the suprarenal IVC. Vascular surgery was consulted for further recommendations.  Of note, this patient has a fairly complicated past medical history.  She states that she began having problems with anemia back in April.  The etiology for this has not been ascertained.  She has undergone an extensive workup including a colonoscopy which was reportedly negative; in addition, she had an upper endoscopy and reports that there was some blood seen in the stomach but no active bleeding could be found.  As part of this workup, she also had an ultrasound of her liver and an incidental finding was cholelithiasis.  She underwent a recent cholecystectomy laparoscopically by Dr. Sheppard Plumber. Earlene Plater on August 21, 1999.  Of note, soon after her procedure, after she had been discharged, she had a reaction to some medicine and for completeness sake, she underwent a CAT scan of her abdomen to be sure that there were no problems related to her laparoscopic cholecystectomy; this did not show any intra-abdominal problems except for suspicion of a filling defect in the IVC. Of note, the patient denies any previous history of DVT.  She does have a history of phlebitis in the right leg, diagnosed in 1990.  She is unaware of any family history of clotting disorders.  She has had no recent leg swelling other than her normal leg swelling from working on her feet most of the time.  PAST MEDICAL HISTORY:  Her past medical history is significant for hypertension; she also has a history of an irregular heart  beat.  She denies any history of diabetes, hypercholesterolemia, history of myocardial infarction or history of congestive heart failure.  SOCIAL HISTORY:  She is married and has a 68 year old son.  She is a [employer] and spends most of her time on her feet when she is working.  She denies any history of tobacco use.  REVIEW OF SYSTEMS:  She states she has been having chest pain off and on since April; in addition, over the last two months, she has been having dyspnea on exertion, which has been gradually increasing.  She also states that she has had pleuritic chest pain over the last two months.  She also has a history of pneumonia and bronchitis in the past.  She also has a history of diarrhea. Review of systems is otherwise negative.  PHYSICAL EXAMINATION  VITAL SIGNS:  On physical examination, temperature is 98, heart rate is 80, blood pressure 120/70.  HEENT/NECK:  There are no obvious oral lesions and no cervical lymphadenopathy.  I do not detect any carotid bruits.  LUNGS:  Clear bilaterally to auscultation.  CARDIAC:  She has a regular rate and rhythm.  ABDOMEN:  Her abdomen is soft and nontender.  I cannot palpate an aneurysm.  EXTREMITIES:  She has palpable femoral and pedal pulses bilaterally.  She has minimal bilateral lower extremity swelling.  NEUROLOGIC:  Exam is nonfocal.  IMAGING STUDIES:  I reviewed her CAT scan which does  show a large saddle embolus.  She also has a filling defect in the suprarenal IVC.  ASSESSMENT:  This patient has evidence of a saddle embolus, based on todays CAT scan.  It is not clear how long she has had this, as she states she has had intermittent chest pain and pleuritic chest pain associated with dyspnea on exertion over the last couple of months.  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  I agree with anticoagulation and she has been started on heparin.  I think it would be safe to start her Coumadin tomorrow and she will likely require at least a six-month course of Coumadin. It would probably be worthwhile obtaining a hypercoagulable workup before her Coumadin is started; this should include at least a protein C, protein S, anticardiolipin antibody and antithrombin III levels.  At this point, I would not recommend an inferior vena cava filter, as at this point there is no contraindication to anticoagulation and she has shown no evidence of embolization while she has been on anticoagulation.  In addition, I think placement of an inferior vena cava filter would be somewhat risky and would be associated with significant risk of embolization during the procedure, given this clot present within the inferior vena cava.  Will obtain a venous Duplex scan of the lower extremities tomorrow; if this is unremarkable, I think it would be safe to mobilize her fairly quickly and we will follow along with you.DD:  09/27/99 TD:  09/28/99 Job: [id number]

## 2010-06-08 NOTE — Procedures (Signed)
[facility]  Patient:    Judith Stokes, Judith Stokes                MRN: [id number] Proc. Date: 10/25/99 Adm. Date:  [id number] Attending:  Caleb Popp CC:         Terald Sleeper, M.D.   Procedure Report  PROCEDURE:  Bronchoscopy.  CHIEF COMPLAINT:  Hemoptysis.  SURGEON:  Shan Levans, M.D.  ANESTHESIA:  1% xylocaine local.  PREOP MEDICATIONS:  Demerol 40 mg IV PUSH, Versed 3 mg IV PUSH.  DESCRIPTION OF PROCEDURE:  The Pentax bronchoscope was introduced to the right naris. The upper airways were visualized and were unremarkable. The entire tracheobronchial tree was visualized and revealed diffuse bronchitis in the left airway with mucosal friability. No endobronchial lesions, however, were seen. No excess mucous was seen. Bronchial washings were obtained and sent for microbiology studies.  COMPLICATIONS:  None.  IMPRESSION:  Tracheobronchitis with associated anticoagulated stated resulted in hemoptysis without evidence of endobronchial lesions to suggest malignancy.  RECOMMENDATIONS:  Maintain INR in the high 2.5 to 3.0 range and begin Pulmicort 2 sprays b.i.d. for topical anti-inflammatory control. DD:  10/25/99 TD:  10/26/99 Job: [id number]

## 2010-06-08 NOTE — Procedures (Signed)
[facility]  Patient:    Judith Stokes, Judith Stokes                MRN: [id number] Proc. Date: 05/21/98 Adm. Date:  [id number] Disc. Date: [id number] Attending:  Sabino Gasser                           Procedure Report  PROCEDURE PERFORMED:  Colonoscopy.  ENDOSCOPIST:  Sabino Gasser, M.D.  INDICATIONS FOR PROCEDURE:  Rectal bleeding with a family history of colon cancer.  ANESTHESIA:  Demerol 100 mg, Versed 10 mg was given intravenously in divided dose.  DESCRIPTION OF PROCEDURE:  With the patient mildly sedated in the left lateral decubitus position, the Olympus videoscopic colonoscope was inserted in the rectum and passed under direct vision into the cecum.  The cecum was identified by the  ileocecal valve and appendiceal orifice, both of which were photographed.  From  this point, the colonoscope was slowly withdrawn, taking circumferential views f the entire colonic mucosa stopping only in the rectum, which appeared normal on  direct and retroflex view.  The endoscope was straightened and pulled back taking circumferential views of the anal mucosa and there appeared to be a pedunculated polypoid structure externally that was reddened and may have been the cause of patients bleeding.  This appeared not to come from colonic mucosa as best I could tell and the endoscope was withdrawn.  Patients vital signs and pulse oximeter remained stable.  The patient tolerated the procedure well and without apparent  complications.  FINDINGS:  Pedunculated external lesions.  Not sure that they are hemorrhoids but will refer to surgeon for further evaluation. DD:  05/21/99 TD:  05/23/99 Job: [id number]

## 2010-06-08 NOTE — Procedures (Signed)
[facility]  Patient:    Judith Stokes, Judith Stokes                MRN: [id number] Adm. Date:  [id number] Disc. Date: [id number] Attending:  Sabino Gasser                           Procedure Report  PROCEDURE:  Upper endoscopy with biopsy.  INDICATIONS:  Iron deficiency anemia.  ANESTHESIA:  Demerol 70 mg, Versed 6.5 mg were given intravenously in divided dose.  DESCRIPTION OF PROCEDURE:  With the patient mildly sedated in the left lateral decubitus position, the Olympus videoscopic endoscope was inserted in the mouth, passed under direct vision through the esophagus, which appeared normal and photographed into the stomach remnant.  What we saw of the stomach appeared normal.  There was a distal Billroth II anastomosis seen.  This was photographed, and we went down each limb, which appeared normal.  We pulled back into the stomach, and the remainder of the gastric remnant was seen. There was an area that appeared to be somewhat friable.  We biopsied it, but we could not advance any further through this area.  Biopsies were taken of the friable area seen.  The endoscope was then placed in retroflexion to view the stomach from below, and this too appeared normal.  The endoscope was then straightened and pulled back from distal to proximal stomach, taking circumferential views of the remaining gastric and esophageal mucosa, which otherwise appeared normal.  The patients vital signs and pulse oximetry remained stable.  The patient tolerated the procedure well without apparent complications.  FINDINGS:  Friable mucosa of gastric remnant presumably.  Await biopsy report. The patient will call in for the results and follow up with me as an outpatient. DD:  06/29/99 TD:  07/03/99 Job: [id number]

## 2010-06-08 NOTE — H&P (Signed)
[facility]  Patient:    Judith Stokes, Judith Stokes                MRN: [id number] Adm. Date:  [id number] Disc. Date: [id number] Attending:  Donnetta Hutching CC:         Gwenith Daily. Tyrone Sage, M.D.  Terald Sleeper, M.D.   History and Physical  CHIEF COMPLAINT:  Chest pain since about September 17, 1999.  HISTORY OF PRESENT ILLNESS:  This patient had some chest discomfort starting about 11 days prior to this admission.  She has been seen in the emergency room and at my office for the discomfort.  She had a CT scan of the chest ordered a week ago, which was accomplished today, which did show a saddle embolus.  This patient has been out of work a little over a month.  She actually had a laparoscopic cholecystectomy done by Timothy E. Earlene Plater, M.D., approximately four weeks ago.  She was discharged from the hospital within 24 hours.  She was seen again in the emergency room on August 23, 1999, for an "allergic reaction" to oxycodone.  She was short of breath and nearly collapsed at that time.  There was no evidence of a rash.  Evaluation during that episode included a CT scan of the abdomen, which was reported to the patient as normal; however, the final reports in the hospital records now show that she had a possible inferior vena cava clot.  The patient went home from that episode after receiving some medications intravenously and some fluids. She returned to the emergency room with the chest discomfort September 18, 1999. At that point, the etiology of her chest discomfort was obscure.  She had negative cardiac enzymes and did not appear to be significantly short of breath.  She was seen both by the emergency room physician and by my partner, Dr. Leanord Hawking, and it was elected not to admit her but to go ahead and send her home.  She was seen in follow-up at our office at [facility] Thursday of last week and at that time, her chest discomfort persisted.   She was having trouble sleeping at night.  She did have some shortness of breath.  Other complaints then included persistent diarrhea and palpitations of the heart.  We did order a CT of the chest at that time, which as noted above was accomplished today, showing her saddle pulmonary embolus.  The patient has been coughing up some phlegm, which has increased over the last couple of days.  She denies any hemoptysis at this time.  She has some discomfort in the chest, which increases with breathing and when lying down. She denies any nausea.  She had diarrhea prior to her cholecystectomy that persists with it and now has developed a somewhat watery character.  She denies any fevers.  She is not a smoker and not on any hormones.  She has had some episodes of heart flutter since at least April 2001.  She says that these seem to be more frequent at this time and recurring nearly daily instead of weekly basis, as they previously occurred.  She has had some right leg discomfort over about the last three days.  She says her leg feels a little swollen and heavy when she walks on it.  PAST MEDICAL HISTORY:  Diagnoses of hypertension, which began April 2001, and Bells palsy in 1994.  PAST SURGICAL HISTORY:  In 1980, gastric bypass for obesity at age 78.  Tubal  ligation in 1995.  Hysterectomy and appendectomy.  On August 21, 1999, cholecystectomy by Dr. Lorelee New.  PROCEDURES:  Has had EGD by Dr. Virginia Rochester and colonoscopy by Dr. Virginia Rochester, both of which the patient reports were normal.  CONSULTANTS:  Limited to Dr. Lorelee New and Dr. Sabino Gasser locally for general surgery and gastroenterology, respectively.  ALLERGIES:  DARVOCET causes nausea.  PENICILLIN has caused problems in the past.  More recently, she has had a possible reaction to OXYCODONE with diaphoresis and shortness of breath as noted above.  I think the allergy that was attributed to this would have to be viewed with some  skepticism, particularly with the evidence of a possible inferior vena cava clot and subsequent demonstration of her pulmonary saddle embolus on todays scan.  DIET:  Regular.  MEDICATIONS:  Zoloft 100 mg daily, Corzide 40/5 one daily, Vioxx 50 mg daily, and Tylenol as needed.  SOCIAL HISTORY:  The patient works as a Education administrator.  She has been married 1-1/2 years for a second marriage.  She does not smoke, and she uses alcohol rarely.  FAMILY HISTORY:  Paternal grandfather died with kidney disease at age 84. Paternal grandmother died with aneurysm of the head.  Maternal grandmother died with aneurysm of the head and also was known to have skin cancer.  The maternal grandfather had cancer of the lung.  Mother is living, with hypertension and noninsulin-dependent diabetes mellitus.  Father living, with coronary artery disease, noninsulin-dependent diabetes mellitus, and hypertension.  Four brothers; one died at age 19, two living and well, one living and with chronic foot problems.  Two sisters; one is currently unknown with prior history of thyroid disorder, second sister is an alcoholic.  REVIEW OF SYSTEMS:  GENERAL:  The patient has lost about eight pounds since her gallbladder was taken out.  She has frontal headaches, which started about three weeks ago, and no prior history of any significant headache problems. Tylenol does help the headache.  She denies any visual problems, tinnitus, or ear problems.  Back is without significant problems.  She has had diarrhea, as noted above, up to five times per day, most recently was watery, but there has been no blood noted.  She has had some occasional epigastric discomfort with heartburn, but this seems to be a minimal problem to her.  She has not had any vomiting.  She has no prior history of ulcer disease or esophagitis that she is currently aware of.  No history of genitourinary problems.  Musculoskeletal review is  likewise negative, and endocrine review is negative.   PHYSICAL EXAMINATION:  VITAL SIGNS:  Temperature 98, pulse 80 and regular, respirations 18, blood pressure 120/70.  GENERAL:  This is an alert and cooperative female, who is an excellent historian.  SKIN:  No petechiae, purpura, ecchymoses, or other rash.  NODES:  None palpable cervical, axillary, inguinal, or other areas.  HEENT:  Pupils equal, round and reactive to light and accommodation, extraocular movements full.  Conjunctivae full.  Sclerae white.  Fundi unremarkable.  Pinnae, TMs, and EACs normal.  Hearing grossly normal. Oropharynx shows full dentures but no other lesions.  NECK:  Supple without thyromegaly, nodule, mass, bruit, or adenopathy.  CHEST:  Clear to auscultation and percussion, is able to draw a deep breath without obvious splinting.  No dyspnea at rest.  BREASTS:  Normal female without nodules or masses.  HEART:  Regular rhythm, PMI left fifth intercostal space midclavicular line . No heaves, thrills, gallops,  murmurs, or rubs.  ABDOMEN:  Nontender, no organomegaly, nodule, mass, or bruit.  There are recent scars from her laparoscopic cholecystectomy.  GENITAL:  Normal external genitalia.  RECTAL:  Heme-negative, normal sphincter tone.  EXTREMITIES:  Diffuse tenderness to the right medial thigh and calf without evidence of erythema, venous cords, or significant swelling.  NEUROLOGIC:  Cranial nerves intact.  No tremor.  No cerebellar dysfunction or asterixis.  Gait was normal.  DIAGNOSTIC EVALUATION:  X-ray September 27, 1999 showed large saddle embolus in the lungs.  August 23, 1999, CT of the pelvis and abdomen showed filling defect, inferior vena cava, with question of clot raised by radiologist.  IMPRESSION:  Pulmonary embolus, possibly dating back to as long ago as August 23, 1999, but obviously could have occurred more recently than that.  There was not an acute change in her condition  today, so I do believe the current x-ray findings are at least several days old.  PLAN:  The patient will be admitted.  Heparin is to be given intravenously. Will consult with CVTS in regard to any further treatment that may be necessary.  CT of the abdomen is to be repeated to see the status of the shadowed area that suggested clot previously.  Doppler of the legs is ordered. DD:  09/27/99 TD:  09/28/99 Job: [id number]

## 2010-06-08 NOTE — Procedures (Signed)
[facility]  Patient:    Judith Stokes, Judith Stokes                MRN: [id number] Proc. Date: 10/04/99 Adm. Date:  [id number] Attending:  Kimber Relic                           Procedure Report  PROCEDURE:  Upper endoscopy.  INDICATIONS: Iron deficiency anemia.  ANESTHESIA:  Demerol 70 mg, Versed 8 mg.  DESCRIPTION OF PROCEDURE:  With the patient adequately sedated in the left lateral decubitus position, the Olympus videoscopic endoscope was inserted through the mouth, passed easily through a normal esophagus, into the stomach. The stomach was noted to have a Billroth II anastomosis and both loops were visualized and traversed and appeared normal.  Photographs were taken.  We pulled back and showed the anastomosis site as well.  Additionally, we photographed the area which had been bypassed.  We entered into it as far as we could and it appeared normal.  There were some diverticula seen off of this area but no gross abnormalities noted.  Then the endoscope was pulled back to view the stomach from above to view this and then placed in retroflexion view of the stomach from below.  Diverticulum was seen which we entered and found no abnormalities.  The endoscope was then straightened and withdrawn, taking circumferential views of any esophageal mucosa which otherwise appeared normal.  The patients vital signs, pulse oximetry remained stable.  The patient tolerated the procedure well without apparent complications.  FINDINGS:  Billroth II anastomosis to the stomach that has been surgically bypassed itself.  No gross abnormalities other than the anatomic changes noted.  Mucosa entirely normal appearing.  No need for biopsies to be taken and plan will be to proceed to colonoscopy.  Consider possible malabsorption as the cause for this patients iron deficiency, such as a lack of acid or secondarily consider celiac disease.  Will check an anti-nucleide  and anti-endomysial antibodies. DD:  10/04/99 TD:  10/05/99 Job: [id number]

## 2010-06-08 NOTE — Procedures (Signed)
[facility]  Patient:    Judith Stokes, Judith Stokes                MRN: [id number] Proc. Date: 10/04/99 Adm. Date:  [id number] Attending:  Kimber Relic                           Procedure Report  PROCEDURE:  Upper endoscopy.  INDICATION FOR PROCEDURE:  Iron deficiency anemia.  ANESTHESIA:  Demerol 70 mg, Versed 8 mg.  DESCRIPTION OF PROCEDURE:  With the patient mildly sedated in the left lateral decubitus position, the Olympus video endoscope was inserted in the mouth and passed easily through a normal esophagus into the stomach. The stomach was noted to have a Billroth II and anastomosis and both loops were visualized and traversed and appeared normal. Photographs were taken. We pulled back and showed the anastomosis site as well. Additionally, we photographed the area which had been bypassed. We entered into it as far as we could and it appeared normal. There were some diverticula seen in this area but no gross abnormalities noted. Then the endoscope was pulled back to view the stomach from above to view this and then placed in retroflexion to view the stomach from below and a diverticulum was seen which we entered and found no abnormalities. The endoscope was then straightened and withdrawn taking circumferential views of the remaining esophageal mucosa which otherwise appeared normal. The patients vital signs and pulse oximeter remained stable. The patient tolerated the procedure well and there were no apparent complications.  FINDINGS:  Billroth II anastomosis to the stomach that has been surgically bypassed itself.  No gross abnormalities other than the anatomic changes noted. Mucosa entirely normal appearing. No need for biopsies to be taken.  PLAN:  Proceed to colonoscopy. Consider possible malabsorption as the cause for this patients iron deficiency such as a lack of acid or secondarily consider celiac disease. Will check an antigliadin and  antiendomysial antibodies. DD:  10/04/99 TD:  10/05/99 Job: [id number]

## 2010-06-08 NOTE — Discharge Summary (Signed)
[place]  Patient:    Judith Stokes, Judith Stokes                MRN: [id number] Adm. Date:  [id number] Disc. Date: 01/01/00 Attending:  Kimber Relic Dictator:   Lorin Picket. Claiborne Billings, N.P. CC:         Genene Churn. Sherin Quarry, M.D.  Lenon Curt Chilton Si, M.D.   Discharge Summary  CHIEF COMPLAINT:  Hypokalemia.  HISTORY OF PRESENT ILLNESS:  A 55 year old female is followed routinely in primarily care by Dr. Lilia Pro.  Admitted from home following determination of hypokalemic state.  This was accompanied by weakness and failure to respond to medications as an outpatient. The patient describes illness greater than ten days with GI distress, nausea, vomiting, watery diarrhea.  Had been supplemented with potassium but that only made her more nauseous.  She had failure to resolve the issues of nausea, vomiting, diarrhea as an outpatient despite Phenergan and Imodium.  Potassium level on the week prior to admission was 2.5 and this rose to 3.1 by December 21, 1999.  It was elected at this point, despite her weakness to continue treating as an outpatient.  Returned to the office for a follow up visit and noted her potassium was again low at 2.3.  She claims inability to hold down her oral potassium.  Also difficulty holding down oral fluids.  She has not tried solid foods.  She has had chronic diarrhea since April 2001 with possible dumping syndrome per upper GI series April 2001.  Extensive evaluation of her GI problems has done nothing to resolve her symptoms.  The patient hospitalized September 2001 for embolus of pulmonary artery and extensive inferior vena cava clot.  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  She is now on chronic Coumadin.  Hematologic evaluation for cause of hypercoagulable state seemed normal.  No source of inferior vena cava clot noted.  X-rays of the lower extremity entirely without clot.  PAST MEDICAL HISTORY:  1.  Hypertension.  2. Depression.  3. Anxiety.  4. Morbid obesity.  5. Iron deficiency anemia.  6. Chronic diarrhea.  7. History of miscarriage times one.  8. Right leg DVT in 1990.  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 10. 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 11. Chronic insomnia August 2001. 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 13. History of palpitations April 2001. 14. Bells palsy in 1994.  PAST SURGICAL HISTORY: 1. Hysterectomy in 1995. 2. Bilateral tubal ligation age 70 about 60. 3. Gastric bypass stapling in 1983. 4. Cholecystectomy and laparoscopic removal by Dr. Lorelee New    August 21, 1999.  PROCEDURES:  1. April 2001 colonoscopy by Dr. Virginia Rochester, normal.  2. June 2001 upper endoscopy by Dr. Virginia Rochester, normal.  3. August 01, 1999 upper GI series:  Small gastric pouch and dumping of     the stomach contents into the colon with rapid transmittal through the     small intestine.  4. July 09, 1999 ultrasound of the abdomen showed solitary gallstone.  5. July 09, 1999 transvaginal ultrasound showed status post     hysterectomy, no pelvic mass.  6. June 22, 1999 upper endoscopy by Dr. Virginia Rochester normal.  7. August 23, 1999 CT scan of the abdomen and pelvis showed possible filling     defect inferior vena cava suggesting clot.  8. September 27, 1999 CT scan of the chest showed large saddle  embolus of     pulmonary artery with 1 cm nodular density left lung base anteriorly     and evidence of old gastric stapling and cholecystectomy.  9. September 29, 1999 CT scan of abdomen and pelvis showed extensive IVC     thrombus. 10. October 11, 1999 CT scan of chest confirmed saddle embolus pulmonary     artery and substernal extension, right sided goiter.  Pulmonary embolus     noted both lungs in the lower segment. 11. 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  No evidence of thrombus in the     lower extremities. 12. November 04, 1999 bronchoscopy by Dr. Shan Levans showed tracheal     bronchitis. 13. October 04, 1999 upper endoscopy by Dr. Sabino Gasser showed     Billroth II anastomosis but otherwise unremarkable. 14. October 04, 1999 colonoscopy by Dr. Virginia Rochester noted normal.  CONSULTS: 1. GI, Dr. Virginia Rochester. 2. General surgery, Dr. Earlene Plater. 3. Pulmonary, Dr. Delford Field. 4. Hematology, Dr. Myna Hidalgo. 5. CVTS, Dr. Durwin Nora.  ALLERGIES:  Penicillin, Prozac, and OxyContin.  MEDICATIONS AT TIME OF ADMISSION: See admission history and physical exam.  FAMILY HISTORY:  Maternal grandmother died with cancer of the lung and emphysema.  Maternal grandmother died with aneurysm of the brain and skin cancer.  The paternal grandfather died at age 68 of kidney disease.  Father living status post angioplasty and coronary artery disease with known non-insulin diabetic diabetes.  Mother living with hypertension and diabetes as well as asthma.  Four brothers, one of whom died age 69 unknown cause.  Two alive and well, one with asthma, one with chronic foot problems.  Two sisters, one with thyroid disorder, one alcoholic.  One son alive and well with possible history of asthma.  SOCIAL HISTORY:  The patient has been married twice.  Her second marriage in 2000.  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  Denies tobacco.  Does use alcohol occasionally.  No history of alcohol abuse or recreational drug use.  REVIEW OF SYSTEMS:  General:  Substantially stressed because of chronic illness.  These include diarrhea, cholecystectomy, pulmonary embolus, recurrent diarrhea, fatigue, edema, depression.  HEENT:  Occasional headache otherwise unremarkable.  Cardiopulmonary:  As above under history of present illness.  No current complaints of shortness of breath or cough.  GI: As  noted  above.  Recent complaints of frequent stools almost immediately following any type of oral intake.  Also nausea and vomiting as above (see history of present illness).  Genitourinary:  History of urinary tract infections, none recently.  No complaints of current urinary tract infection.  Musculoskeletal: A variety of joint pains but no inflamed joints.  Generalized weakness.  Skin is unremarkable.  Neurologic: Denies seizure.  Some headaches from time to time. Psychiatric:  Significant depression history on antidepressant medications.  Thinks that perhaps most recent antidepressant Zoloft may be in some way associated with diarrhea.  Endocrine:  No history of diabetes. Hematologic/Lymphatic:  Hypercoagulable state as above.  Immunizations: Pneumovax November 21, 1999.  Flu shot October 2000.  PHYSICAL EXAMINATION:  Vital signs on admission:  Weight 186, that is down from January weight of 214.  Blood pressure 130/90, respirations 16, pulse 80. General:  Obese elderly female who was alert and cooperative.  No acute distress.  Skin: No evidence of ecchymosis, petechiae, or other abnormalities. HEENT:  Head normocephalic.  Extraocular movements are full.  Sclerae are white and conjunctivae are clear.  PERRLA.  External auditory canals and tympanic membranes are unremarkable.  Hearing is grossly normal.  Oral pharynx without lesion.  Neck:  Supple with no thyromegaly.  No masses, bruits or adenopathy noted.  There was no palpable cervical, axillary, inguinal, or other areas.  Breasts:  Small nodule left upper outer quadrant which is probably a small cyst.  Generalized fibrocystic texture to both breasts. Heart:  PMI left 5th intercostal space at the midclavicular line.  No murmur, S3 or S4.  Pulses are 3 to 4+ bilaterally and symmetrical.  Chest:  Clear without distress.  Abdomen: Obese. Well healed scar in the midline.  There was no tenderness or organomegaly noted.  Positive bowel  sounds throughout.  GI: Normal external female genitalia.  Bimanual exam deferred.  Rectal: Normal sphincter tone with occult negative stool.  Back: Nontender and without deformity.  Extremities:  Range of motion normal times four.  Neurologic: Cranial nerves 2 through 12 grossly intact.  Deep tendon reflexes 2+ and symmetric. No evidence of trauma.  Speech normal.  Strength estimated 5/5.  HOSPITAL COURSE:  The patient was admitted for further evaluation and noted hypokalemia.  ANA was negative and HIV nonreactive.  Stool negative for occult blood.  CBC found hemoglobin low 11.6, hematocrit low at 34.7, MCV low 72.3, RDW high 15.2, platelets high 472, all other values normal.  Sedimentation rate elevated at 43.  Coags:  PT 18.5 and INR 1.9. Metabolic panel with sodium 134, potassium low at 2.3, chloride low 81, CO2 high 39, BUN high 26, all other values essentially normal.  TSH was high at 7.220.  Stool was obtained for C. difficile and culture.  C. difficile results were negative and stool produced no growth.  Abdominal x-rays showed no bowel obstruction, no free air, postoperative changes.  Seen in consult by Dr. Tasia Catchings.  The patient stated she no longer wanted to be followed by Dr. Sabino Gasser of gastroenterology.  He agreed to discontinue Imodium.  Requested magnesium level checked and replaced if low.  Aggressive replacement of potassium and lactose free diet.  He started the patient on Questran 4 gm three times daily. He felt the patient was suffering from post cholecystectomy diarrhea along with irritable bowel.  Diuretics were held and the patient was aggressively replaced with potassium supplementation.  On Questran, the patients diarrhea resolved and she is having one to two formed stools daily now.  The patients hypokalemia slowly resolved with aggressive replacement.  INR on 8 mg of Coumadin daily climbed to 6.6.  Coumadin was held and prior to discharge, INR has  returned to low therapeutic range 2.1.  Will restart Coumadin at 6 mg daily and the patient should be followed up for a repeat PT and INR at [facility] on January 03, 2000.  Also a close follow up with Dr. Johnsie Kindred in approximately one week.  Of note, urine potassium level was elevated at 33.  Will not restart previous diuretic Dyazide.  Will restart Corzide as previous.  CONDITION ON DISCHARGE:  Stable.  LABORATORY DATA:  Follow up CBC on January 01, 2000 finds WBC 4.9, hemoglobin low 9.7, hematocrit low 30.0, platelets 365.  Metabolic panel finds sodium 141, potassium 3.9, chloride 108, CO2 27, BUN 7, creatinine 0.7.  Follow up PT/INR January 01, 2000 was 19.5 and 2.1 respectively.  Again ANA results negative and HIV nonreactive.  Stool was negative for occult blood.  Will need to follow this again as an outpatient.  DISCHARGE MEDICATIONS: 1. Restoril 15 mg daily at bedtime. 2. Wellbutrin 100 mg three times daily. 3. Questran 4 gm every eight hours. 4. Potassium chloride liquid 40 mEq once daily. 5. Ultram 50 mg every four hours as needed for pain. 6. Xanax 0.5 mg three times daily as needed for anxiety. 7. Coumadin 6 mg one tablet daily. 8. Corzide 40/5 mg one tablet daily.  DISCHARGE DIAGNOSES: 1. Diarrhea thought secondary to post cholecystectomy and irritable bowel    syndrome, markedly improved on Questran therapy. 2. Hypokalemia, improved/normalized on replacement.  This is thought secondary    to diarrhea and diuretics. 3. Anemia with occult blood negative stool.  This will need to be followed    as an outpatient and further evaluated if this continues to worsen. 4. Chronic Coumadin therapy for history of hypocoagulation with previous    vena cava clot and pulmonary embolus. 5. Hypertension, stable.  SPECIAL DISCHARGE INSTRUCTIONS: 1. The patient will need repeat PT/INR at [facility] in two days, January 03, 2000 with results reported to    Dr. Murray Hodgkins primary care. 2. Follow up with Dr. Chilton Si in approximately one week.  The patient should    have stool for occult blood, repeat CBC without differential, basic    metabolic panel at that time.  Blood pressure rechecked and decision     regarding continuation of antihypertensive at that time.  CONDITION ON DISCHARGE:  Stable.  DISCHARGE PROCESS:  Greater than 30 minutes, 10:45 through 11:30 a.m. DD:  01/01/00 TD:  01/01/00 Job: [id number]

## 2010-06-08 NOTE — Procedures (Signed)
[facility]  Patient:    Judith Stokes, Judith Stokes                MRN: [id number] Proc. Date: 10/04/99 Adm. Date:  [id number] Attending:  Kimber Relic                           Procedure Report  PROCEDURE:  Colonoscopy.  INDICATION FOR PROCEDURE:  Iron deficiency anemia.  ANESTHESIA:  An additional 30 mg Demerol, Versed 4 mg.  DESCRIPTION OF PROCEDURE:  With the patient mildly sedated in the left lateral decubitus position, the Olympus videoscopic colonoscope was inserted in the rectum and passed under direct vision to the cecum. The cecum was identified by the ileocecal valve and appendiceal orifice both of which were photographed. We entered into the terminal ileum which also appeared normal and was photographed. From this point, the endoscope was slowly withdrawn taking circumferential views of the entire terminal ileum visualized along with the colon as we pulled back all the way to the rectum which appeared normal on direct view and on retroflexed view showed minor irritations. We pulled through the anal canal which was photographed as well. The endoscope was withdrawn. The patients vital signs and pulse oximeter remained stable. The patient tolerated the procedure well without apparent complications.  FINDINGS:  Essentially negative colonoscopic examination including the terminal ileum.  PLAN:  See endoscopy note for further details but no further evaluation felt necessary at this point of the colon or upper intestinal tract. Give consideration if no other findings for small bowel follow through. DD: 10/04/99 TD:  10/05/99 Job: [id number]

## 2010-06-08 NOTE — Consult Note (Signed)
[facility]  Patient:    Judith Stokes, Judith Stokes                MRN: [id number] Proc. Date: 10/04/99 Adm. Date:  [id number] Attending:  Kimber Relic CC:         Terald Sleeper, M.D.  Sabino Gasser, M.D.  Charlcie Cradle Delford Field, M.D. [facility]  Di Kindle. Edilia Bo, M.D.   Consultation Report  REFERRING PHYSICIAN:  Dr. Terald Sleeper.  REASON FOR CONSULTATION:  Pulmonary emboli, rule out hypercoagulable state.  HISTORY OF PRESENT ILLNESS:  Ms. Judith Stokes is a 55 year old white female with multiple medical problems.  She does have a history of anemia.  She underwent colonoscopy back in April 2001 for anemia; I suspect this was negative.  She also underwent an EGD in June which was negative.  She underwent a cholecystectomy back on August 21, 1999.  At that time she was noted to have partial occlusion of the IVC.  She has been complaining of pain in the right side of her chest/back.  She has been to the emergency room a couple of times.  She has been discharged home each time.  At one point, it was felt like she may have had shingles.  She subsequently underwent a repeat CT scan on September 27, 1999.  This revealed a saddle-type embolus in the pulmonary artery.  There is a 1-cm defect in the left lung base thought to be scarring.  No other abnormalities were noted.  She was admitted previously and started on heparin.  CT of the abdomen and pelvis was done which showed extensive thrombus through the IVC. No other abnormalities were noted within the abdomen and pelvis.  Her workup to date has shown a negative factor V leiden mutation and normal protein C and protein S levels.  She has had a normal antithrombin III level. Her electrolytes show a mildly depressed albumin at 3.4.  Her iron studies show an iron less than 10 and a serum ferritin of 15.  She will undergo another GI workup for this.  Her sedimentation rate was 44.  Of note, she has had a  miscarriage in the past; this was at 5 months.  There is a history of three miscarriages within her family.  PAST MEDICAL HISTORY 1. Cholecystectomy. 2. Hysterectomy. 3. Gastric bypass. 4. Tubal ligation. 5. History of "phlebitis."  ALLERGIES 1. PENICILLIN. 2. DARVOCET-N. 3. OXYCODONE.  MEDICATIONS 1. Zoloft 100 mg q.d. 2. Vioxx 25 mg q.d. 3. Questran 4 g t.i.d.  FAMILY HISTORY:  Her family history is remarkable for diabetes and coronary artery disease.  There is a history of breast cancer with two maternal aunts. There is no family history of thrombi or emboli.  SOCIAL HISTORY:  She has no history of tobacco use.  There is rare alcohol use.  She works as a [employer]  She has been on estrogen in the past.  REVIEW OF SYSTEMS:  Her review of systems is as in the history of present illness.  PHYSICAL EXAMINATION  GENERAL:  This is a somewhat obese white female in no obvious distress.  VITAL SIGNS:  Temperature is 96.7, pulse is 74, respiratory rate 20 and blood pressure is 122/70.  HEAD AND NECK:  Normocephalic, atraumatic skull.  There are no ocular or oral lesions.  There are no palpable cervical or supraclavicular lymph nodes.  LUNGS:  Decreased breath sounds at the bases.  CARDIAC:  Regular rate and rhythm with a normal S1 and S2.  There are no murmurs, rubs or bruits.  ABDOMEN:  Exam shows a soft abdomen with good bowel sounds.  There is no palpable abdominal mass.  There is no palpable hepatosplenomegaly.  She has several laparotomy scars.  EXTREMITIES:  No clubbing, cyanosis, or edema.  She has good range of motion of her joints.  There is no swelling, erythema or warmth of her joints.  NEUROLOGIC:  No focal neurological deficits.  SKIN:  No rashes, ecchymoses or petechiae.  IMPRESSION:  Judith Stokes is a 55 year old female with pulmonary embolus.  She has a thrombus in the inferior vena cava.  No doubt, the thrombus in the inferior vena cava broke off and now  has lodged in her pulmonary artery.  It certainly appears that the only risk factor that we can find so far is that she developed this inferior vena cava thrombus with her cholecystectomy. Given her other risk factors (i.e., obesity, immobility), she certainly has a reason for a pulmonary embolus.  As far as hypercoagulable conditions, I suspect that we will not find any obvious one for her.  I again feel like she has had miscarriage; sending off anticardiolipin antibodies would not be unreasonable.  We can also send off homocystine levels, prothrombin gene mutation.  As for her underlying malignancy, there is no obvious manifestation of this. She is to undergo a repeat endoscopy and colonoscopy.  I suspect that these will be negative.  Her persistent iron deficiency could be from either a lack of adequate oral intact or she may have persistent small bowel blood loss from either arteriovenous malformations or angiodysplasia.  As far as her anticoagulation is concerned, I would keep her on Coumadin for at least one year, if we do not find any hypercoagulable condition; I believe that the current recommendation for idiopathic pulmonary emboli is one year of anticoagulation.  I would maintain her INR between 3.0-3.5.  If we do find an underlying hypercoagulable condition, then we certainly could be looking at anticoagulation lifelong.  At this point, we will just follow Judith Stokes along.  Unless we find some underlying hypercoagulable condition, I do not believe that we need to follow her as an outpatient, as I would not be adding much at all to her management.  I am most grateful for the opportunity to see Judith Stokes. DD:  10/04/99 TD:  10/04/99 Job: [id number]

## 2010-06-08 NOTE — Discharge Summary (Signed)
[facility]  Patient:    Judith Stokes, Judith Stokes                MRN: [id number] Adm. Date:  [id number] Disc. Date: 10/05/99 Attending:  Kimber Relic CC:         Sabino Gasser, M.D.  Di Kindle. Edilia Bo, M.D.  Rose Phi. Myna Hidalgo, M.D.  Charlcie Cradle Delford Field, M.D. [facility]   Discharge Summary  DATE OF BIRTH:  1955-07-02  CHIEF COMPLAINT:  Chest pain since August 27.  HISTORY:  This patient had some chest discomfort starting about 11 days prior to admission. She had been seen in the emergency room and at  Hospital office for discomfort. She had a CT scan of the chest ordered a week ago which was accomplished on the day of admission which showed a saddle pulmonary embolism. This patient had been out of work for over a month. She actually had a laparoscopic cholecystectomy done by Dr. Kendrick Ranch approximately 4 weeks ago. She was discharged from the hospital within 24 hours. She was seen again in the emergency room on August 2 for an allergic reaction to OXYCODONE. She was short of breath and nearly collapsed at that time. There was no evidence of a rash. Evaluation during that episode included a CT scan of the abdomen which was reported to shows as normal; however, the final reports in the hospital do show a possible clot in her inferior vena cava. The patient went home from that episode after receiving some medications intravenously and some fluids. She returned to the emergency room with chest discomfort on August 28. At that point, the etiology of her chest discomfort was obscure, eventually thought to be of ______/radicular etiology. She had negative cardiac enzymes and did not appear to be significantly short of breath. On the days prior to admission, she had trouble sleeping and did have some shortness of breath. Other complaints included persistent diarrhea and palpitations of the heart. CT scan was ordered eventually showing the pulmonary embolism.  The  patient had been coughing up some phlegm. She denied any hemoptysis. She had some discomfort in the chest with increases with breathing and with lying down. She denied any nausea. She had diarrhea prior to her cholecystectomy that persists with it and now has developed somewhat a watery character. She denies any fevers. She is not a smoker and has had a past hysterectomy and is not on any hormones.  She had some episodes of heart fluttering since at least April 2001 and some right leg discomfort over the last 3 days.  PAST MEDICAL HISTORY:  Diagnosis of hypertension which began April 2001, Bells palsy 1994, in 1980 had a gastric bypass for obesity, tubal ligation in 1995. She had a hysterectomy and appendectomy and apparently did have miscarriages prior to this. On August 21, 1999, she had a cholecystectomy by Dr. Kendrick Ranch. She has also had an EGD by Dr. Virginia Rochester and a colonoscopy by Dr. Virginia Rochester for iron deficiency anemia both of which were normal.  MEDICATIONS ON ADMISSION:  Zoloft 100 mg q.d., Corzide 40/5 1 daily, Vioxx 50 daily, and Tylenol as needed.  SOCIAL HISTORY:  The patient works as a Education administrator. She has been married for 1.5 years, her second marriage. She does not smoke and uses alcohol rarely.  FAMILY HISTORY:  Paternal grandfather died with kidney disease at age 82. Paternal grandmother died with aneurysm in the head and maternal grandmother died with aneurysm in the head and  was also known to have skin cancer. Maternal grandfather had lung cancer. Mother is living with hypertension and non-insulin dependent diabetes. Father living with coronary artery disease, non-insulin dependent diabetes mellitus and hypertension. Four brothers, one died at age 28, 2 living and well, 1 living with chronic foot problems. Two sisters, 1 is currently unknown with a prior history of thyroid disorders, second sister is an alcoholic.  REVIEW OF SYSTEMS:  Essentially as in the HPI;  however, GI subsequently revealed complaint of bright red blood per rectum in May of this year.  PHYSICAL EXAMINATION:  VITAL SIGNS:  Temperature 98, pulse 80 and regular, respirations 18, blood pressure 120/70.  GENERAL:  This is an alert and cooperative woman who is an excellent historian, no petechial rash, propria or ecchymosis.  NODES:  None palpable.  HEENT:  Unremarkable.  NECK:  Without thyromegaly, nodule, mass or adenopathy.  CHEST:  Clear.  BREASTS:  Normal female without nodule or masses.  HEART:  Regular rhythm. PMI is in the left fifth intercostal space. No heaves, thrills, gallops, murmurs or rubs.  ABDOMEN:  Nontender. No organomegaly, nodule, mass or bruits. There are recent scars from her laparoscopic cholecystectomy.  GENITALIA:  Normal female external genitalia.  RECTAL:  Heme negative. Normal appearing sphincter.  EXTREMITIES:  Diffuse tenderness to the right medial thigh and calf without evidence of erythema.  NEUROLOGIC:  Cranial nerves were intact.  LABORATORY AND OTHER INVESTIGATIONS:  Anticardiolipin antibody IgM less than 11 (negative), anticardiolipin antibody IgG less than 12 (negative). Stool culture was negative. C difficile toxin was negative. Urinalysis was unremarkable. Total iron was less than 10, ferritin was 15, complete metabolic profile was normal other than an albumin of 3.4. Protein C was 124 (in the normal range 80-166), protein S level 144 (in the normal range 68-128). Factor 5 Litten mutation was negative. Admission PT 13.8, INR 1.1, PTT was 25, ESR was 44. Admission hemoglobin 6.6, hemoglobin was 9.8 repetitively checked within this range throughout the course of the admission. Platelet count was normal. Room air blood gases on admission, pH was 7.46, pCO2 43.6, PO2 was  74.1, bicarb 30.4. On September 10, pH was 7.38, pCO2 of 41, PO2 of 70.3, bicarb of 24.4. INR at discharge was 3.1 on both September 13 and  September 14.  RADIOLOGY:  Chest CT, saddle type embolism in the pulmonary artery, 1 cm nodular density at the left base thought to represent scarring; however, repeat CT scan in 4-6 months was recommended to assess stability. She was status post gastric stapling and cholecystectomy. Repeat CT scan was done on September 10, interval development of pulmonary parenchymal changes in the lower lung zones, sequelae of extensive pulmonary emboli, substernal extension of right sided goiter, previously described left lung base density unchanged recommending 4-6 month follow-up. Top normal size enlarged paratracheal lymph nodes of questionable significance. CT scan of the abdomen showed an extensive clot throughout the IVC. CT scan of the pelvis, urinary bladder is unremarkable. A small adnexal mass was felt to represent a small left ovarian cyst with the overall impression being negative CT scan of the pelvis.  EKG showed normal EKG.  HOSPITAL COURSE:  This 55 year old woman was admitted with extensive bilateral pulmonary emboli thought to be secondary to an inferior vena cava clot. The inferior vena cava clot was actually shown a CT scan done earlier in the month. She was seen in the emergency room on August 28; however, her description of her symptoms were much more ______ in  nature including tenderness under her right breast and along a radicular type distribution. She was treated acutely with heparin. She was seen in consultation by Dr. Cari Caraway who concurred with the workup and recommended a workup for a hypercoagulable state. She was not felt to be a candidate for a filter because the IVC clot extended above the renal veins. On this, she remained reasonabley stable although her initial presentation showed hypoxemia. Subsequent CT scan of the chest showed bilateral lower lobe densities probably secondary to pulmonary infarction.  She underwent an extensive workup for predispositions  to this including all the blood work noted above. She was seen in consultation by Dr. Danise Mina who recommended hypercoagulable workup. An echocardiogram was also recommended; however, I do not see the report at the time of dictation. Again, she was not felt to be a candidate for an IVC filter. The original CT scan was discussed with radiology who felt that a repeat CT scan was in order due to the patients persistent complaints of pain and shortness of breath. The results were noted above. Eventually, these complaints subsided although she is still complaining of right posterior rib type pain.  The patient does have a history of iron deficiency anemia, previous endocolon by Dr. Virginia Rochester were negative. She gave a history of recurrent rectal bleedig in May (bright red blood per rectum), she was therefore worked up again for this with an endo showing gastric bypass without lesion and colon exam was normal. Her stools were guaiac negative on at least 2 separate occasions by myself and Dr. Chilton Si on digital rectal exam. The etiology of her iron deficiency is not clear which could represent a small bowel lesion or iron malabsorption. I believe antigliadin endomysial antibodies were drawn.  IMPRESSIONS: 1. Bilateral pulmonary emboli with likely pulmonary infarction. 2. Pulmonary nodule on CT scan recommended follow-up in 3-6 months. 3. Iron deficiency anemia (see discussion above). 4. Diarrhea of obscure etiology post cholecystectomy discharged on Questran    which seems to be helping. 5. Extensive hypercoagulable state workup essentially negative. 6. Inferior vena cava clot (see discussion above).  DISCHARGE MEDICATIONS: 1. Zoloft 100 q.d. 2. Coumadin 5 mg p.o. q.d. at 1600. 3. Xanax 0.25 p.o. t.i.d. p.r.n. 4. Corzide 40/5 1 p.o. q.d. 5. Ultram 50 q. 6 p.r.n. 6. Questran 4 gm b.i.d. at 8 oclock in the morning and at 8 p.m. (should    not interfere with her Coumadin absorption). 7. Ferrous  sulfate 325 p.o. b.i.d.  PLAN:  The patient to be seen by Dr. Chilton Si in the office in 2 weeks. She will need a PT and INR at the [facility] office next week on Coumadin. There is a recommendation for a CT scan of the chest in 3-4 months to access stability of a left lung base density.  CONDITION ON DISCHARGE:  The patient is discharged in stable condition. This has been extensively discussed by myself with her. DD:  10/05/99 TD:  10/05/99 Job: [id number]

## 2010-06-08 NOTE — H&P (Signed)
[facility]  Patient:    Judith Stokes, Judith Stokes                MRN: [id number] Adm. Date:  [id number] Attending:  Kimber Relic                         History and Physical  CHIEF COMPLAINT: Hypokalemia.  HISTORY OF PRESENT ILLNESS: This patient is a 55 year old white female admitted from home following determination of a hypokalemic state and profound weakness that has failed to resolve despite medications as an outpatient. This lady has been ill for greater than ten days with problems of GI distress, nausea, vomiting, and watery diarrhea.  She had been supplemented with potassium, which has only made her more nauseous.  She has had failure to resolve the issues of her nausea, vomiting, and diarrhea as an outpatient despite use of Phenergan and Imodium.  Her potassium level last week was initially measured at 2.5 mEq/l and then rose to 3.1 mEq/l on December 21, 1999.  It was elected at that point despite her weakness and persistence of symptoms to continue to try to treat her as an outpatient.  She has done quite poorly since her visit to the office on that day.  Now her potassium is significantly low again at 2.3 mEq/l.  She claims inability to hold down her KCl by mouth.  She has had difficulty holding down fluids.  She has not tried any solid foods.  She has had chronic diarrhea since April 2001 and a possible dumping syndrome per an upper GI series in April 2001.  Extensive evaluations for GI problems have been done without resolution of her symptoms.  These will be detailed below.  The patient had been hospitalized in September 2001 for a saddle embolus of her pulmonary artery and extensive inferior vena cava clot.  There are no obvious residual problems from this.  She probably had a right lower lobe pulmonary infarct.  She is now on chronic Coumadin.  Hematologic evaluation for cause of hypercoagulable state seemed normal.  There was no  source of the inferior vena cava clot.  X-rays of the lower extremities were entirely without clot.  Current issues then would seem to be extreme need for repletion of potassium stores and hopefully to find a solution to her chronic diarrhea.  PAST MEDICAL HISTORY:  1. Hypertension.  2. Depression.  3. Anxiety.  4. Morbid obesity.  5. Iron deficiency anemia.  6. Chronic diarrhea.  7. History of miscarriage x 1.  8. Right leg DVT in 1990.  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 10. Pulmonary embolus with large saddle embolus of the pulmonary artery in     August 2001. 11. Chronic insomnia in August 2001. 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 13. History of palpitations in April 2001. 14. Bells palsy in 1994.  PAST SURGICAL HISTORY:  1. Hysterectomy in 1995.  2. Bilateral tubal ligation at age 78, about 53.  3. Gastric bypass stapling in 1983.  4. Cholecystectomy by laparoscopic removal by Dr. Lorelee New on August 21, 1999.  PROCEDURES:  1. April 2001, colonoscopy by Dr. Virginia Rochester was normal.  2. June 2001, esophagogastroduodenoscopy by Dr. Virginia Rochester was also normal.  3. July 02, 1999, upper gastrointestinal series with high-density contrast     showed small gastric pouch and dumping of the stomach contents into the  colon with rapid transmit through the small intestine.  4. July 09, 1999, ultrasound of the abdomen showed solitary gallstone.  5. July 09, 1999, transvaginal ultrasound showed the patient to be status     post hysterectomy, with no pelvic mass.  6. June 22, 1999, esophagogastroduodenoscopy by Dr. Virginia Rochester was normal.  7. August 23, 1999, CT of the abdomen and pelvis showed a possible filling     defect in the inferior vena cava suggestive of clot.  8. September 27, 1999, CT of the chest showed a large saddle embolus in the     pulmonary artery, with 1 cm nodular density of the left lung base     anteriorly, and evidence of old gastric stapling and  cholecystectomy.  9. September 29, 1999, CT of the abdomen and pelvis showed extensive IVC     thrombus. 10. October 11, 1999, CT of the chest confirmed saddle embolus of the     pulmonary artery and a substernal extension of a right-sided goiter.     Pulmonary emboli were noted in both lower lungs. 11. October 29, 1999, CT of the chest showed improved clot burden in the     pulmonary arteries, recanalization of some of the pulmonary emboli, and an     old pulmonary infarct in the right lower lobe.  There was no evidence of     thrombus of the lower extremities. 12. October 25, 1999, bronchoscopy by Dr. Danise Mina showed tracheobronchitis. 13. October 04, 1999, esophagogastroduodenoscopy by Dr. Virginia Rochester showed Bilroth 2     anastomosis but was otherwise unremarkable. 14. October 04, 1999, colonoscopy by Dr. Virginia Rochester was noted to be normal.  CONSULTANTS:  1. Gastroenterology, Sabino Gasser, M.D.  2. General surgery, Sheppard Plumber. Earlene Plater, M.D.  3. Pulmonary, Charlcie Cradle. Delford Field, M.D.  4. Hematology, Rose Phi. Myna Hidalgo, M.D.  5. CV/TS, Di Kindle. Edilia Bo, M.D.  ALLERGIES:  1. DARVOCET-N has caused nausea.  2. PENICILLIN caused shortness of breath and swelling.  3. PROZAC may have aggravated her depression.  While on this medication she     says she, "Put a gun to my head."  4. OXYCONTIN caused severe shortness of breath and swelling.  CURRENT MEDICATIONS:  1. Zoloft 100 mg q.d. since April 2001.  2. Coumadin 8 mg q.d.  3. ______ 40/5 mg 1 q.d.  4. Demadex 10 mg q.d.  5. Ultram 50 mg p.r.n.  6. Xanax 0.5 mg t.i.d. p.r.n. anxiety.  7. Phenergan p.r.n.  8. Imodium A-D p.r.n.  FAMILY HISTORY: Maternal grandfather died with cancer of the lung and emphysema.  Maternal grandmother died with aneurysm of the brain and skin cancer.  Paternal grandfather died at the age of 70 with kidney disease. Father is living, status post angioplasty with coronary artery disease and known non-insulin dependent  diabetes mellitus.  Mother is living, with hypertension and non-insulin dependent diabetes mellitus and asthma.  There are four brothers, one of whom died at the age of three of unknown cause; two  are alive and well; one with asthma; one with chronic foot problems.  Two sisters, one with thyroid disorder and one alcoholic.  One son alive and well, with possible history of asthma.  SOCIAL HISTORY: The patient has been married twice, her second marriage in 2000.  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  She denies the use of tobacco.  She does use alcohol occasionally.  There is no history of abuse of tobacco, alcohol,  or recreational drugs.  REVIEW OF SYSTEMS: GENERAL: The patient is substantially stressed because of the chronic ill health she has encountered since spring of 2001, with problems of diarrhea, then cholecystectomy, then pulmonary embolus, and then recurrence of her chronic diarrhea problems, as well as significant fatigue, edema, and depression.  HEENT: Occasional headache, otherwise unremarkable. CARDIOPULMONARY: See above for prior diagnoses, currently short of breath with exertion.  She previously has had significant chest discomfort in the right lower lobe and flank area of the chest, but without pulmonary rub; note area of possible infarction noted on CT scan and this seems to be resolving at the present time.  She has had significant problems with palpitations in the past and this came under control with ______.  She is short of breath with exertion.  She denies cough.  There was an episode of hemoptysis which was felt related to tracheobronchitis by Dr. Delford Field following a bronchoscopy and CT scan of the chest.  GASTROINTESTINAL: See notes above.  The patient denies any recent blood in the stool; however, earlier this year she did have blood in the stool.  There was no evidence of where this came from on  endoscopic evaluations by Dr. Virginia Rochester or subsequent x-rays.  GENITOURINARY: There is a history of urinary tract infections in the past but none recently.  The patient denies dysuria, vaginal discharge, or discomfort.  MUSCULOSKELETAL: The patient has had a variety of joint pains but no inflamed joints.  There are generalized weakness.  SKIN: Unremarkable.  NEUROLOGIC: The patient denies seizures.  There are some headaches from time to time.  PSYCHIATRIC: There is significant depression history.  She feels that she as benefited some from Zoloft.  Prozac was used at times in the past; however, she felt like this aggravated her depression.  She is not certain whether the onset of her diarrhea coincided with the onset of use of the Zoloft.  She believes that the diarrhea preceded the use of the Zoloft.  ENDOCRINE: There is no history of diabetes.  HEMATOLOGIC/LYMPHATIC: The patient has had an extensive evaluation of possibility for hypercoagulable state and this has included tests of prothrombin G2, O2, 1O that were negative.  Homocystine was normal at 9.14. B2 glycoprotein was 23, with normal being less than 22; this may be associated with anti-phospholipid syndrome.  B2 glycoprotein antibody IgM was 5, with normal usually being less than 22.  B2 glycoprotein 1 antibody IgG was 3, with normal being less than 22.  Factor 5 Leiden, protein C, and protein S were all negative, as was antithrombin 3.  It should also be noted that laboratory evaluation of her chronic diarrhea has included a TSH that was 7.334 and negative antibodies for endomesial cell and gliadin.  IMMUNIZATIONS:  1. Pneumovax was given on November 21, 1999.  2. Flu shot in October 2000.  PHYSICAL EXAMINATION:  VITAL SIGNS: Weight 186 pounds, down from 214 pounds in January 2001. Blood pressure 130/90, respirations 16, pulse 80.  GENERAL: This patient is an obese white female, who is alert and cooperative; somewhat angry acting  during portions of the examination.  SKIN: No evidence of ecchymosis, petechiae, or other abnormalities.  HEENT: Normal symmetry of the face.  Extraocular movements appear full. Sclerae are white.  PERRLA.  Conjunctivae clear.  Fundi unremarkable.  Pinna, external auditory canals, and tympanic membranes are normal.  Hearing is grossly normal.  Oropharynx without lesions.  NECK: Supple.  No thyromegaly, nodules, mass, bruits, or adenopathy.  NODES:  None palpable in cervical, axillary, inguinal, or other areas.  BREAST: There is a small nodule in the left upper outer quadrant of the left breast, which I believe to be a small cyst.  There was a generalized fibrocystic texture to both breast.  HEART: PMI at left fifth intercostal space, midclavicular line.  No heaves or thrills.  Regular rhythm without gallop, murmur, click, or rub on auscultation.  Pulses +3-+4 and bilaterally symmetric.  CHEST: Lungs clear to auscultation, breathing easily at rest.  ABDOMEN: Obese.  Healed scar noted in the midline.  Mild tenderness noted in the epigastric area.  Positive bowel sounds.  No evidence of rigidity.  GU: Normal external genitalia.  Bimanual pelvic examination.  RECTAL: Normal sphincter tone.  Empty ampulla.  Hemoccult negative stool.  BACK: Nontender and without deformity.  EXTREMITIES: Range of motion of extremities is normal in all four extremities.  NEUROLOGIC: Cranial nerves normal.  Deep tendon reflexes +2 and symmetric.  No evidence of tremor.  Normal speech.  Generally normal strength other than her diffuse weakness and no evidence of paralysis or paresis.  IMPRESSION:  1. Severe hypokalemia.  2. Chronic diarrhea.  3. Dumping syndrome.  4. Pulmonary embolus history.  5. History of substernal goiter.  6. Depression.  PLAN: The patient is being admitted to the hospital and she will receive potassium supplemented intravenously as well as rehydration.  We will attempt to  control her chronic diarrhea better and she will undergo re-evaluation of this. DD:  12/26/99 TD:  12/27/99 Job: [id number]

## 2010-10-12 LAB — I-STAT 8, (EC8 V) (CONVERTED LAB)
Acid-Base Excess: 1
Chloride: 106
HCT: 40
Operator id: 161631
Potassium: 4
Sodium: 139
TCO2: 27

## 2010-10-12 LAB — URINALYSIS, ROUTINE W REFLEX MICROSCOPIC
Bilirubin Urine: NEGATIVE
Glucose, UA: NEGATIVE
Hgb urine dipstick: NEGATIVE
Protein, ur: NEGATIVE
Urobilinogen, UA: 0.2

## 2010-10-12 LAB — POCT CARDIAC MARKERS
CKMB, poc: 1.1
Troponin i, poc: <0.05

## 2010-10-12 LAB — D-DIMER, QUANTITATIVE: D-Dimer, Quant: <0.22

## 2010-10-12 LAB — PROTIME-INR: Prothrombin Time: 31.8 — ABNORMAL HIGH

## 2010-10-12 LAB — POCT I-STAT CREATININE: Operator id: 161631

## 2010-10-19 LAB — BASIC METABOLIC PANEL
CO2: 30
Calcium: 8.9
Chloride: 98
Creatinine, Ser: 0.7
GFR calc Af Amer: >60
Glucose, Bld: 104 — ABNORMAL HIGH
Sodium: 139

## 2010-10-19 LAB — PROTIME-INR: INR: 1.7 — ABNORMAL HIGH

## 2010-10-19 LAB — APTT: aPTT: 33

## 2010-10-31 LAB — DIFFERENTIAL
Lymphocytes Relative: 39
Lymphs Abs: 2.4
Monocytes Relative: 10
Neutro Abs: 2.9
Neutrophils Relative %: 46

## 2010-10-31 LAB — I-STAT 8, (EC8 V) (CONVERTED LAB)
BUN: 10
Bicarbonate: 20
Chloride: 110
Glucose, Bld: 98
HCT: 40
Hemoglobin: 13.6
Operator id: 196461
Potassium: 3.6
Sodium: 140

## 2010-10-31 LAB — POCT I-STAT CREATININE
Creatinine, Ser: 0.9
Operator id: 196461

## 2010-10-31 LAB — CBC
MCV: 81
Platelets: 406 — ABNORMAL HIGH
RBC: 4.46
WBC: 6.2

## 2010-10-31 LAB — ETHANOL: Alcohol, Ethyl (B): 283 — ABNORMAL HIGH

## 2014-01-03 ENCOUNTER — Encounter (INDEPENDENT_AMBULATORY_CARE_PROVIDER_SITE_OTHER): Payer: Self-pay

## 2014-01-03 ENCOUNTER — Ambulatory Visit (INDEPENDENT_AMBULATORY_CARE_PROVIDER_SITE_OTHER): Payer: Self-pay | Admitting: Family Medicine

## 2014-01-03 VITALS — BP 167/79 | HR 64 | Temp 97.5°F | Ht 61.5 in | Wt 198.0 lb

## 2014-01-03 DIAGNOSIS — M1990 Unspecified osteoarthritis, unspecified site: Secondary | ICD-10-CM

## 2014-01-03 MED ORDER — MELOXICAM 15 MG PO TABS: 15.0000 mg | ORAL_TABLET | Freq: Every day | ORAL | Status: DC

## 2014-01-03 MED ORDER — OXYCODONE-ACETAMINOPHEN 5-325 MG PO TABS: 1.0000 | ORAL_TABLET | Freq: Three times a day (TID) | ORAL | Status: DC | PRN

## 2014-01-03 NOTE — Progress Notes (Signed)
   Subjective:    Patient ID: Judith Stokes, female    DOB: 10/04/1955, 58 y.o.   MRN: [id number]  HPI Patient is here for c/o left knee pain and swelling.  She states she had fluid drawn off her left knee in the past. She c/o moderate to severe pain.   Review of Systems    No chest pain, SOB, HA, dizziness, vision change, N/V, diarrhea, constipation, dysuria, urinary urgency or frequency, myalgias, arthralgias or rash.  Objective:   Physical Exam  TTP left knee with pre-tibial swelling and crepitus with flexion and extension.  Negative varus/valgus strain and negative lachman and drawer.  Procedure - Left lateral knee prepped with betadine and the knee injected and aspiration attempted but only a cc of serous sanguin drainage obtained and then 2 cc of lidocaine 2 cc of marcaine and one cc of kenalog injected into knee space and band aid applied.      Assessment & Plan:  Arthritis - Plan: oxyCODONE-acetaminophen (ROXICET) 5-325 MG per tablet, meloxicam (MOBIC) 15 MG tablet  Follow up prn  Deatra CanterWilliam J Oxford FNP

## 2014-04-22 ENCOUNTER — Encounter: Payer: Self-pay | Admitting: Family

## 2014-04-22 ENCOUNTER — Ambulatory Visit: Payer: Self-pay | Admitting: Family

## 2014-04-22 VITALS — BP 165/110 | HR 75 | Temp 97.8°F | Ht 61.5 in | Wt 187.8 lb

## 2014-04-22 DIAGNOSIS — F411 Generalized anxiety disorder: Secondary | ICD-10-CM

## 2014-04-22 DIAGNOSIS — F329 Major depressive disorder, single episode, unspecified: Secondary | ICD-10-CM

## 2014-04-22 DIAGNOSIS — G4700 Insomnia, unspecified: Secondary | ICD-10-CM | POA: Insufficient documentation

## 2014-04-22 DIAGNOSIS — F32A Depression, unspecified: Secondary | ICD-10-CM | POA: Insufficient documentation

## 2014-04-22 MED ORDER — ESCITALOPRAM OXALATE 20 MG PO TABS: 20.0000 mg | ORAL_TABLET | Freq: Every day | ORAL | Status: DC

## 2014-04-22 MED ORDER — CLONAZEPAM 2 MG PO TABS: 2.0000 mg | ORAL_TABLET | Freq: Two times a day (BID) | ORAL | Status: DC | PRN

## 2014-04-22 NOTE — Progress Notes (Signed)
   Subjective:    Patient ID: Judith Stokes, female    DOB: [birth year], 59 y.o.   MRN: [id number]  HPI Pt presents to the office today to discuss depression. Pt states she lost her husband a month ago and lost her mom and dad previously in the year. Pt is very tearful at the visit. Pt states she has been on medication about 2 years ago. Klonopin BID and "some sleeping meds". Pt states she cannot sleep at night. Pt stats she takes her BP at home and it avg 120's/70's. Pt states "I'm just stressed today".    Review of Systems  Constitutional: Negative.   HENT: Negative.   Eyes: Negative.   Respiratory: Negative.  Negative for shortness of breath.   Cardiovascular: Negative.  Negative for palpitations.  Gastrointestinal: Negative.   Endocrine: Negative.   Genitourinary: Negative.   Musculoskeletal: Negative.   Neurological: Negative.  Negative for headaches.  Hematological: Negative.   Psychiatric/Behavioral: Negative.   All other systems reviewed and are negative.      Objective:   Physical Exam  Constitutional: She is oriented to person, place, and time. She appears well-developed and well-nourished. No distress.  HENT:  Head: Normocephalic.  Eyes: Pupils are equal, round, and reactive to light.  Neck: Normal range of motion. Neck supple. No thyromegaly present.  Cardiovascular: Normal rate, regular rhythm, normal heart sounds and intact distal pulses.   No murmur heard. Pulmonary/Chest: Effort normal and breath sounds normal. No respiratory distress. She has no wheezes.  Abdominal: Soft. Bowel sounds are normal. She exhibits no distension. There is no tenderness.  Musculoskeletal: Normal range of motion. She exhibits no edema or tenderness.  Neurological: She is alert and oriented to person, place, and time. She has normal reflexes. No cranial nerve deficit.  Skin: Skin is warm and dry.  Psychiatric: Her behavior is normal. Judgment and thought content normal. Her mood appears  anxious. She exhibits a depressed mood.  Pt very tearful during visit  Vitals reviewed.   BP 165/110 mmHg  Pulse 75  Temp(Src) 97.8 F (36.6 C) (Oral)  Ht 5' 1.5" (1.562 m)  Wt 187 lb 12.8 oz (85.186 kg)  BMI 34.91 kg/m2       Assessment & Plan:  1. Depression -Stress management discussed - BMP8+EGFR - escitalopram (LEXAPRO) 20 MG tablet; Take 1 tablet (20 mg total) by mouth daily.  Dispense: 90 tablet; Refill: 3  2. Insomnia -Bed time ritual  - BMP8+EGFR - clonazePAM (KLONOPIN) 2 MG tablet; Take 1 tablet (2 mg total) by mouth 2 (two) times daily as needed for anxiety.  Dispense: 60 tablet; Refill: 3  3. GAD (generalized anxiety disorder) -Stress management discussed - BMP8+EGFR - escitalopram (LEXAPRO) 20 MG tablet; Take 1 tablet (20 mg total) by mouth daily.  Dispense: 90 tablet; Refill: 3 - clonazePAM (KLONOPIN) 2 MG tablet; Take 1 tablet (2 mg total) by mouth 2 (two) times daily as needed for anxiety.  Dispense: 60 tablet; Refill: 3  RTO in 2 weeks  Evelina Dun, FNP

## 2014-04-22 NOTE — Patient Instructions (Signed)
Insomnia Insomnia is frequent trouble falling and/or staying asleep. Insomnia can be a long term problem or a short term problem. Both are common. Insomnia can be a short term problem when the wakefulness is related to a certain stress or worry. Long term insomnia is often related to ongoing stress during waking hours and/or poor sleeping habits. Overtime, sleep deprivation itself can make the problem worse. Every little thing feels more severe because you are overtired and your ability to cope is decreased. CAUSES   Stress, anxiety, and depression.  Poor sleeping habits.  Distractions such as TV in the bedroom.  Naps close to bedtime.  Engaging in emotionally charged conversations before bed.  Technical reading before sleep.  Alcohol and other sedatives. They may make the problem worse. They can hurt normal sleep patterns and normal dream activity.  Stimulants such as caffeine for several hours prior to bedtime.  Pain syndromes and shortness of breath can cause insomnia.  Exercise late at night.  Changing time zones may cause sleeping problems (jet lag). It is sometimes helpful to have someone observe your sleeping patterns. They should look for periods of not breathing during the night (sleep apnea). They should also look to see how long those periods last. If you live alone or observers are uncertain, you can also be observed at a sleep clinic where your sleep patterns will be professionally monitored. Sleep apnea requires a checkup and treatment. Give your caregivers your medical history. Give your caregivers observations your family has made about your sleep.  SYMPTOMS   Not feeling rested in the morning.  Anxiety and restlessness at bedtime.  Difficulty falling and staying asleep. TREATMENT   Your caregiver may prescribe treatment for an underlying medical disorders. Your caregiver can give advice or help if you are using alcohol or other drugs for self-medication. Treatment  of underlying problems will usually eliminate insomnia problems.  Medications can be prescribed for short time use. They are generally not recommended for lengthy use.  Over-the-counter sleep medicines are not recommended for lengthy use. They can be habit forming.  You can promote easier sleeping by making lifestyle changes such as:  Using relaxation techniques that help with breathing and reduce muscle tension.  Exercising earlier in the day.  Changing your diet and the time of your last meal. No night time snacks.  Establish a regular time to go to bed.  Counseling can help with stressful problems and worry.  Soothing music and white noise may be helpful if there are background noises you cannot remove.  Stop tedious detailed work at least one hour before bedtime. HOME CARE INSTRUCTIONS   Keep a diary. Inform your caregiver about your progress. This includes any medication side effects. See your caregiver regularly. Take note of:  Times when you are asleep.  Times when you are awake during the night.  The quality of your sleep.  How you feel the next day. This information will help your caregiver care for you.  Get out of bed if you are still awake after 15 minutes. Read or do some quiet activity. Keep the lights down. Wait until you feel sleepy and go back to bed.  Keep regular sleeping and waking hours. Avoid naps.  Exercise regularly.  Avoid distractions at bedtime. Distractions include watching television or engaging in any intense or detailed activity like attempting to balance the household checkbook.  Develop a bedtime ritual. Keep a familiar routine of bathing, brushing your teeth, climbing into bed at the same   time each night, listening to soothing music. Routines increase the success of falling to sleep faster.  Use relaxation techniques. This can be using breathing and muscle tension release routines. It can also include visualizing peaceful scenes. You can  also help control troubling or intruding thoughts by keeping your mind occupied with boring or repetitive thoughts like the old concept of counting sheep. You can make it more creative like imagining planting one beautiful flower after another in your backyard garden.  During your day, work to eliminate stress. When this is not possible use some of the previous suggestions to help reduce the anxiety that accompanies stressful situations. MAKE SURE YOU:   Understand these instructions.  Will watch your condition.  Will get help right away if you are not doing well or get worse. Document Released: 01/05/2000 Document Revised: 04/01/2011 Document Reviewed: 02/04/2007 [facility] Patient Information 2015 [place], [place]. This information is not intended to replace advice given to you by your health care provider. Make sure you discuss any questions you have with your health care provider. Generalized Anxiety Disorder Generalized anxiety disorder (GAD) is a mental disorder. It interferes with life functions, including relationships, work, and school. GAD is different from normal anxiety, which everyone experiences at some point in their lives in response to specific life events and activities. Normal anxiety actually helps [place] prepare for and get through these life events and activities. Normal anxiety goes away after the event or activity is over.  GAD causes anxiety that is not necessarily related to specific events or activities. It also causes excess anxiety in proportion to specific events or activities. The anxiety associated with GAD is also difficult to control. GAD can vary from mild to severe. People with severe GAD can have intense waves of anxiety with physical symptoms (panic attacks).  SYMPTOMS The anxiety and worry associated with GAD are difficult to control. This anxiety and worry are related to many life events and activities and also occur more days than not for 6 months or longer. People  with GAD also have three or more of the following symptoms (one or more in children):  Restlessness.   Fatigue.  Difficulty concentrating.   Irritability.  Muscle tension.  Difficulty sleeping or unsatisfying sleep. DIAGNOSIS GAD is diagnosed through an assessment by your health care provider. Your health care provider will ask you questions aboutyour mood,physical symptoms, and events in your life. Your health care provider may ask you about your medical history and use of alcohol or drugs, including prescription medicines. Your health care provider may also do a physical exam and blood tests. Certain medical conditions and the use of certain substances can cause symptoms similar to those associated with GAD. Your health care provider may refer you to a mental health specialist for further evaluation. TREATMENT The following therapies are usually used to treat GAD:   Medication. Antidepressant medication usually is prescribed for long-term daily control. Antianxiety medicines may be added in severe cases, especially when panic attacks occur.   Talk therapy (psychotherapy). Certain types of talk therapy can be helpful in treating GAD by providing support, education, and guidance. A form of talk therapy called cognitive behavioral therapy can teach you healthy ways to think about and react to daily life events and activities.  Stress managementtechniques. These include yoga, meditation, and exercise and can be very helpful when they are practiced regularly. A mental health specialist can help determine which treatment is best for you. Some people see improvement with one therapy.  However, other people require a combination of therapies. Document Released: 05/04/2012 Document Revised: 05/24/2013 Document Reviewed: 05/04/2012 [facility] Patient Information 2015 [place], [place]. This information is not intended to replace advice given to you by your health care provider. Make sure you  discuss any questions you have with your health care provider. Depression Depression refers to feeling sad, low, down in the dumps, blue, gloomy, or empty. In general, there are two kinds of depression:  Normal sadness or normal grief. This kind of depression is one that we all feel from time to time after upsetting life experiences, such as the loss of a job or the ending of a relationship. This kind of depression is considered normal, is short lived, and resolves within a few days to 2 weeks. Depression experienced after the loss of a loved one (bereavement) often lasts longer than 2 weeks but normally gets better with time.  Clinical depression. This kind of depression lasts longer than normal sadness or normal grief or interferes with your ability to function at home, at work, and in school. It also interferes with your personal relationships. It affects almost every aspect of your life. Clinical depression is an illness. Symptoms of depression can also be caused by conditions other than those mentioned above, such as:  Physical illness. Some physical illnesses, including underactive thyroid gland (hypothyroidism), severe anemia, specific types of cancer, diabetes, uncontrolled seizures, heart and lung problems, strokes, and chronic pain are commonly associated with symptoms of depression.  Side effects of some prescription medicine. In some people, certain types of medicine can cause symptoms of depression.  Substance abuse. Abuse of alcohol and illicit drugs can cause symptoms of depression. SYMPTOMS Symptoms of normal sadness and normal grief include the following:  Feeling sad or crying for short periods of time.  Not caring about anything (apathy).  Difficulty sleeping or sleeping too much.  No longer able to enjoy the things you used to enjoy.  Desire to be by oneself all the time (social isolation).  Lack of energy or motivation.  Difficulty concentrating or  remembering.  Change in appetite or weight.  Restlessness or agitation. Symptoms of clinical depression include the same symptoms of normal sadness or normal grief and also the following symptoms:  Feeling sad or crying all the time.  Feelings of guilt or worthlessness.  Feelings of hopelessness or helplessness.  Thoughts of suicide or the desire to harm yourself (suicidal ideation).  Loss of touch with reality (psychotic symptoms). Seeing or hearing things that are not real (hallucinations) or having false beliefs about your life or the people around you (delusions and paranoia). DIAGNOSIS  The diagnosis of clinical depression is usually based on how bad the symptoms are and how long they have lasted. Your health care provider will also ask you questions about your medical history and substance use to find out if physical illness, use of prescription medicine, or substance abuse is causing your depression. Your health care provider may also order blood tests. TREATMENT  Often, normal sadness and normal grief do not require treatment. However, sometimes antidepressant medicine is given for bereavement to ease the depressive symptoms until they resolve. The treatment for clinical depression depends on how bad the symptoms are but often includes antidepressant medicine, counseling with a mental health professional, or both. Your health care provider will help to determine what treatment is best for you. Depression caused by physical illness usually goes away with appropriate medical treatment of the illness. If prescription medicine is causing  depression, talk with your health care provider about stopping the medicine, decreasing the dose, or changing to another medicine. Depression caused by the abuse of alcohol or illicit drugs goes away when you stop using these substances. Some adults need professional help in order to stop drinking or using drugs. SEEK IMMEDIATE MEDICAL CARE IF:  You have  thoughts about hurting yourself or others.  You lose touch with reality (have psychotic symptoms).  You are taking medicine for depression and have a serious side effect. FOR MORE INFORMATION  National Alliance on Mental Illness: www.nami.[employer]  National Institute of Mental Health: http://www.maynard.net/www.nimh.nih.gov Document Released: 01/05/2000 Document Revised: 05/24/2013 Document Reviewed: 04/08/2011 [facility] Patient Information 2015 [place]. This information is not intended to replace advice given to you by your health care provider. Make sure you discuss any questions you have with your health care provider.

## 2014-04-23 LAB — BMP8+EGFR
BUN / CREAT RATIO: 20 (ref 9–23)
BUN: 14 mg/dL (ref 6–24)
CO2: 29 mmol/L (ref 18–29)
Calcium: 9.2 mg/dL (ref 8.7–10.2)
Chloride: 97 mmol/L (ref 97–108)
Creatinine, Ser: 0.71 mg/dL (ref 0.57–1.00)
GFR calc Af Amer: 109 mL/min/{1.73_m2} (ref >59)
GFR, EST NON AFRICAN AMERICAN: 95 mL/min/{1.73_m2} (ref >59)
Glucose: 114 mg/dL — ABNORMAL HIGH (ref 65–99)
Potassium: 4.5 mmol/L (ref 3.5–5.2)
Sodium: 140 mmol/L (ref 134–144)

## 2014-04-25 ENCOUNTER — Telehealth: Payer: Self-pay | Admitting: Family

## 2014-04-26 NOTE — Telephone Encounter (Signed)
lmovm at [phone]

## 2014-04-28 NOTE — Telephone Encounter (Signed)
Pt is at max dose of Klonopin. Pt may need to schedule appt with psychiatry

## 2014-04-28 NOTE — Telephone Encounter (Signed)
Pt aware of Christy Hawk's recommendations, mailing patient list of Psychologist/Psychiatrists in area.

## 2014-05-10 ENCOUNTER — Ambulatory Visit: Payer: Self-pay | Admitting: Family

## 2014-10-05 ENCOUNTER — Other Ambulatory Visit: Payer: Self-pay | Admitting: Family

## 2014-10-05 NOTE — Telephone Encounter (Signed)
Last seen 04/22/14  Judith Stokes If approved route to nurse to call into [facility]  [phone]

## 2014-10-06 NOTE — Telephone Encounter (Signed)
Refill left on pharmacy VM

## 2020-11-27 ENCOUNTER — Other Ambulatory Visit: Payer: Self-pay

## 2020-11-27 ENCOUNTER — Ambulatory Visit: Admission: EM | Admit: 2020-11-27 | Discharge: 2020-11-27 | Discharge status: Absent without leave | Payer: Self-pay

## 2020-11-27 ENCOUNTER — Encounter: Payer: Self-pay | Admitting: Emergency Medicine

## 2020-11-27 ENCOUNTER — Ambulatory Visit
Admission: EM | Admit: 2020-11-27 | Discharge: 2020-11-27 | Disposition: A | Discharge status: Home / Self Care | Payer: Self-pay | Attending: Urgent Care | Admitting: Urgent Care

## 2020-11-27 DIAGNOSIS — M79601 Pain in right arm: Secondary | ICD-10-CM

## 2020-11-27 DIAGNOSIS — T22231A Burn of second degree of right upper arm, initial encounter: Secondary | ICD-10-CM

## 2020-11-27 DIAGNOSIS — X101XXA Contact with hot food, initial encounter: Secondary | ICD-10-CM

## 2020-11-27 DIAGNOSIS — Z23 Encounter for immunization: Secondary | ICD-10-CM

## 2020-11-27 MED ORDER — DOXYCYCLINE HYCLATE 100 MG PO CAPS: 100.0000 mg | ORAL_CAPSULE | Freq: Two times a day (BID) | ORAL | 0 refills | Status: DC

## 2020-11-27 MED ORDER — TETANUS-DIPHTH-ACELL PERTUSSIS 5-2.5-18.5 LF-MCG/0.5 IM SUSY
0.5000 mL | PREFILLED_SYRINGE | Freq: Once | INTRAMUSCULAR | Status: AC
  Administered 2020-11-27: 0.5 mL via INTRAMUSCULAR

## 2020-11-27 MED ORDER — SILVER SULFADIAZINE 1 % EX CREA: 1.0000 "application " | TOPICAL_CREAM | Freq: Every day | CUTANEOUS | 0 refills | Status: DC

## 2020-11-27 MED ORDER — NAPROXEN 500 MG PO TABS: 500.0000 mg | ORAL_TABLET | Freq: Two times a day (BID) | ORAL | 0 refills | Status: DC

## 2020-11-27 NOTE — ED Provider Notes (Signed)
[facility]   MRN: [id number] DOB: April 20, 1955  Subjective:   Judith Stokes is a 65 y.o. female presenting for 1 week history of persistent right arm pain.  This started from a burn that she suffered while trying to blow chicken.  This date from it came up to her right arm.  Has been using multiple treatments including Neosporin, peroxide, Alocaine.  Has left the wound open.  Is not cleaning it as much as she should.  Cannot recall her last Tdap.  Feels a hot sensation over the wound.  No drainage, fevers, radiation of the pain, nausea, vomiting.  No current facility-administered medications for this encounter.  Current Outpatient Medications:    clonazePAM (KLONOPIN) 2 MG tablet, TAKE 1 TABLET TWICE A DAY AS NEEDED FOR ANXIETY, Disp: 60 tablet, Rfl: 0   escitalopram (LEXAPRO) 20 MG tablet, Take 1 tablet (20 mg total) by mouth daily., Disp: 90 tablet, Rfl: 3   meloxicam (MOBIC) 15 MG tablet, Take 1 tablet (15 mg total) by mouth daily. (Patient not taking: No sig reported), Disp: 30 tablet, Rfl: 11   oxyCODONE-acetaminophen (ROXICET) 5-325 MG per tablet, Take 1 tablet by mouth every 8 (eight) hours as needed for severe pain. (Patient not taking: No sig reported), Disp: 20 tablet, Rfl: 0   Allergies  Allergen Reactions   Oxycontin [Oxycodone Hcl] Hives   Penicillins Hives   Celexa [Citalopram Hydrobromide] Other (See Comments)    Anxious, fidgety    Past Medical History:  Diagnosis Date   History of blood clots      Past Surgical History:  Procedure Laterality Date   ABDOMINAL HYSTERECTOMY     GASTRIC BYPASS     HAND TENDON SURGERY Left    TONSILLECTOMY     TUBAL LIGATION      Family History  Problem Relation Age of Onset   Cancer Mother    COPD Father    Cancer Sister     Social History   Tobacco Use   Smoking status: Never  Substance Use Topics   Alcohol use: No    Alcohol/week: 0.0 standard drinks   Drug use: No    ROS   Objective:    Vitals: BP (!) 189/90   Pulse 77   Temp 98.8 F (37.1 C) (Oral)   Resp 16   SpO2 98%   BP Readings from Last 3 Encounters:  11/27/20 (!) 189/90  11/21/16 132/84  04/22/14 (!) 165/110   Physical Exam Constitutional:      General: She is not in acute distress.    Appearance: Normal appearance. She is well-developed. She is not ill-appearing, toxic-appearing or diaphoretic.  HENT:     Head: Normocephalic and atraumatic.     Nose: Nose normal.     Mouth/Throat:     Mouth: Mucous membranes are moist.     Pharynx: Oropharynx is clear.  Eyes:     General: No scleral icterus.       Right eye: No discharge.        Left eye: No discharge.     Extraocular Movements: Extraocular movements intact.     Conjunctiva/sclera: Conjunctivae normal.     Pupils: Pupils are equal, round, and reactive to light.  Cardiovascular:     Rate and Rhythm: Normal rate.  Pulmonary:     Effort: Pulmonary effort is normal.  Skin:    General: Skin is warm and dry.       Neurological:     General: No  focal deficit present.     Mental Status: She is alert and oriented to person, place, and time.  Psychiatric:        Mood and Affect: Mood normal.        Behavior: Behavior normal.        Thought Content: Thought content normal.        Judgment: Judgment normal.         Assessment and Plan :   PDMP not reviewed this encounter.  1. Partial thickness burn of right upper arm, initial encounter   2. Right arm pain   3. Need for diphtheria-tetanus-pertussis (Tdap) vaccine    A dressing was applied using Silvadene cream, nonadherent, Kerlix and Ace wraps.  Counseled extensively on wound care.  Given the nature of the wound, lack of care, tenderness and warmth will cover for infection with doxycycline.  Tdap updated. Counseled patient on potential for adverse effects with medications prescribed/recommended today, ER and return-to-clinic precautions discussed, patient verbalized understanding.     Wallis Bamberg, [place] 11/27/20 1541

## 2020-11-27 NOTE — ED Triage Notes (Signed)
PT was draining boiling chicken and has large steam burn to right lower and upper arm. She is using neosporin and Alocaine ointment.

## 2020-11-27 NOTE — Discharge Instructions (Addendum)
Please change your dressing 2-3 times daily. Apply silvadene cream generously and secure with a non-stick dressing. Wrap it with the roll of gauze. Then use the Ace wrap alternating with clean Ace wraps each time. Each time you change your dressing, make sure you clean gently around the perimeter of the wound with gentle soap and warm water. Do not peel off any dead skin. If it comes off in the process of washing your wound or removing the dressing, that's okay. Pat your wound dry and let it air out if possible for an hour before reapplying another dressing. Start doxycycline to help against infection. Use naproxen for pain and inflammation.

## 2022-11-22 DIAGNOSIS — H9202 Otalgia, left ear: Secondary | ICD-10-CM | POA: Diagnosis not present

## 2022-11-22 DIAGNOSIS — J029 Acute pharyngitis, unspecified: Secondary | ICD-10-CM | POA: Diagnosis not present

## 2022-12-03 ENCOUNTER — Ambulatory Visit (INDEPENDENT_AMBULATORY_CARE_PROVIDER_SITE_OTHER): Payer: Medicare HMO | Admitting: Nurse Practitioner

## 2022-12-03 ENCOUNTER — Encounter: Payer: Self-pay | Admitting: Nurse Practitioner

## 2022-12-03 VITALS — BP 174/97 | HR 90 | Temp 97.8°F | Ht 61.5 in | Wt 171.8 lb

## 2022-12-03 DIAGNOSIS — Z0001 Encounter for general adult medical examination with abnormal findings: Secondary | ICD-10-CM

## 2022-12-03 DIAGNOSIS — F332 Major depressive disorder, recurrent severe without psychotic features: Secondary | ICD-10-CM | POA: Diagnosis not present

## 2022-12-03 DIAGNOSIS — Z833 Family history of diabetes mellitus: Secondary | ICD-10-CM | POA: Diagnosis not present

## 2022-12-03 DIAGNOSIS — E66811 Obesity, class 1: Secondary | ICD-10-CM | POA: Diagnosis not present

## 2022-12-03 DIAGNOSIS — I10 Essential (primary) hypertension: Secondary | ICD-10-CM | POA: Diagnosis not present

## 2022-12-03 DIAGNOSIS — Z780 Asymptomatic menopausal state: Secondary | ICD-10-CM | POA: Diagnosis not present

## 2022-12-03 DIAGNOSIS — R631 Polydipsia: Secondary | ICD-10-CM | POA: Diagnosis not present

## 2022-12-03 DIAGNOSIS — F418 Other specified anxiety disorders: Secondary | ICD-10-CM | POA: Diagnosis not present

## 2022-12-03 DIAGNOSIS — Z6831 Body mass index (BMI) 31.0-31.9, adult: Secondary | ICD-10-CM

## 2022-12-03 DIAGNOSIS — Z8249 Family history of ischemic heart disease and other diseases of the circulatory system: Secondary | ICD-10-CM | POA: Diagnosis not present

## 2022-12-03 DIAGNOSIS — E6609 Other obesity due to excess calories: Secondary | ICD-10-CM | POA: Diagnosis not present

## 2022-12-03 DIAGNOSIS — Z0000 Encounter for general adult medical examination without abnormal findings: Secondary | ICD-10-CM | POA: Insufficient documentation

## 2022-12-03 LAB — BAYER DCA HB A1C WAIVED: HB A1C (BAYER DCA - WAIVED): 5.1 % (ref 4.8–5.6)

## 2022-12-03 MED ORDER — LOSARTAN POTASSIUM-HCTZ 50-12.5 MG PO TABS: 1.0000 | ORAL_TABLET | Freq: Every day | ORAL | 0 refills | Status: DC

## 2022-12-03 MED ORDER — ESCITALOPRAM OXALATE 10 MG PO TABS: 10.0000 mg | ORAL_TABLET | Freq: Every day | ORAL | 0 refills | Status: DC

## 2022-12-03 NOTE — Progress Notes (Signed)
New Patient Office Visit  Subjective    Patient ID: Judith Stokes, female    DOB: 1955-06-08  Age: 67 y.o. MRN: [id number]  CC:  Chief Complaint  Patient presents with   Establish Care    HPI Judith Stokes 67 year old female presents December 03, 2022 to establish care concern for possible hypertension  Past medical history of depression and anxiety  HTN: [facility] was 200/94 since he was not symptomatic no hypertensive therapy was dispensed.  BP today 174/97 client denied dizziness, headache, change in vision, lightheadedness, syncope.  She is agreeable to start antihypertensive medication she is instructed to keep a log of her BP at home and to bring it back at her 1 week follow-up nurse visit to check BP Report family history of cardiac issue "my brother just had open heart surgery, my dad had a heart attack".  Depression: Patient complains of depression. She complains of anhedonia, depressed mood, and insomnia. Onset was approximately 1 year ago, unchanged since that time.  She denies current suicidal and homicidal plan or intent.   Family history significant for no psychiatric illness.Possible organic causes contributing are: none.  Risk factors: previous episode of depression Previous treatment includes Lexapro and none. She complains of the following side effects from the treatment: none.   Anxiety: Patient complains of anxiety disorder.  She has the following symptoms: difficulty concentrating, fatigue, feelings of losing control, irritable. Onset of symptoms was approximately 1 year ago, unchanged since that time. She denies current suicidal and homicidal ideation. Family history significant for no psychiatric illness.Possible organic causes contributing are: none. Risk factors: previous episode of depression Previous treatment includes Lexapro and none.  She complains of the following side effects from the treatment: none.   Polydipsia presents with a new  onset of polydipsia over the past 3-weeks liters] of fluids per day, which is significantly higher than usual. This has been associated with polyuria, described as urinating approximately 5-6 times per day, including during the night. The patient denies any changes in diet, but has noted feeling fatigued and occasionally lightheaded, especially when standing. The patient has not experienced any significant weight changes, and denies fever, nausea, or vomiting. There is no history of recent infection or medications that would explain the symptoms. There is no known history of diabetes, but the patient has a family history of type 2 diabetes.  Health maintenance: Normal colonoscopy in 2018 repeat in 10 years; will schedule mammogram at checkout today; DEXA scan ordered today.  She still needs to schedule Medicare annual wellness  Outpatient Encounter Medications as of 12/03/2022  Medication Sig   acetaminophen (TYLENOL) 325 MG tablet Take 650 mg by mouth every 6 (six) hours as needed.   diphenhydrAMINE (BENADRYL) 25 mg capsule Take 25 mg by mouth every 6 (six) hours as needed.   escitalopram (LEXAPRO) 10 MG tablet Take 1 tablet (10 mg total) by mouth daily.   losartan-hydrochlorothiazide (HYZAAR) 50-12.5 MG tablet Take 1 tablet by mouth daily.   magnesium 30 MG tablet Take 30 mg by mouth 2 (two) times daily.   [DISCONTINUED] clonazePAM (KLONOPIN) 2 MG tablet TAKE 1 TABLET TWICE A DAY AS NEEDED FOR ANXIETY (Patient not taking: Reported on 12/03/2022)   [DISCONTINUED] doxycycline (VIBRAMYCIN) 100 MG capsule Take 1 capsule (100 mg total) by mouth 2 (two) times daily. (Patient not taking: Reported on 12/03/2022)   [DISCONTINUED] escitalopram (LEXAPRO) 20 MG tablet Take 1 tablet (20 mg total) by mouth  daily. (Patient not taking: Reported on 12/03/2022)   [DISCONTINUED] meloxicam (MOBIC) 15 MG tablet Take 1 tablet (15 mg total) by mouth daily. (Patient not taking: Reported on 04/22/2014)   [DISCONTINUED]  naproxen (NAPROSYN) 500 MG tablet Take 1 tablet (500 mg total) by mouth 2 (two) times daily with a meal. (Patient not taking: Reported on 12/03/2022)   [DISCONTINUED] oxyCODONE-acetaminophen (ROXICET) 5-325 MG per tablet Take 1 tablet by mouth every 8 (eight) hours as needed for severe pain. (Patient not taking: Reported on 04/22/2014)   [DISCONTINUED] silver sulfADIAZINE (SILVADENE) 1 % cream Apply 1 application topically daily. (Patient not taking: Reported on 12/03/2022)   No facility-administered encounter medications on file as of 12/03/2022.    Past Medical History:  Diagnosis Date   History of blood clots     Past Surgical History:  Procedure Laterality Date   ABDOMINAL HYSTERECTOMY     GASTRIC BYPASS     HAND TENDON SURGERY Left    TONSILLECTOMY     TUBAL LIGATION      Family History  Problem Relation Age of Onset   Cancer Mother    COPD Father    Cancer Sister     Social History   Socioeconomic History   Marital status: Married    Spouse name: Not on file   Number of children: Not on file   Years of education: Not on file   Highest education level: Not on file  Occupational History   Not on file  Tobacco Use   Smoking status: Never   Smokeless tobacco: Not on file  Substance and Sexual Activity   Alcohol use: No    Alcohol/week: 0.0 standard drinks of alcohol   Drug use: No   Sexual activity: Not on file  Other Topics Concern   Not on file  Social History Narrative   Not on file   Social Determinants of Health   Financial Resource Strain: Low Risk  (12/03/2022)   Overall Financial Resource Strain (CARDIA)    Difficulty of Paying Living Expenses: Not hard at all  Food Insecurity: No Food Insecurity (12/03/2022)   Hunger Vital Sign    Worried About Running Out of Food in the Last Year: Never true    Ran Out of Food in the Last Year: Never true  Transportation Needs: No Transportation Needs (12/03/2022)   PRAPARE - Scientist, research (physical sciences) (Medical): No    Lack of Transportation (Non-Medical): No  Physical Activity: Not on file  Stress: Not on file  Social Connections: Not on file  Intimate Partner Violence: Not At Risk (12/03/2022)   Humiliation, Afraid, Rape, and Kick questionnaire    Fear of Current or Ex-Partner: No    Emotionally Abused: No    Physically Abused: No    Sexually Abused: No    ROS Negative unless indicated in HPI   Objective    BP (!) 174/97   Pulse 90   Temp 97.8 F (36.6 C) (Temporal)   Ht 5' 1.5" (1.562 m)   Wt 171 lb 12.8 oz (77.9 kg)   SpO2 96%   BMI 31.94 kg/m   Physical Exam Vitals and nursing note reviewed.  Constitutional:      Appearance: She is obese.  HENT:     Head: Normocephalic and atraumatic.  Eyes:     Extraocular Movements: Extraocular movements intact.     Conjunctiva/sclera: Conjunctivae normal.     Pupils: Pupils are equal, round, and reactive to light.  Neck:     Vascular: No carotid bruit.  Cardiovascular:     Rate and Rhythm: Normal rate and regular rhythm.  Pulmonary:     Effort: Pulmonary effort is normal.     Breath sounds: Normal breath sounds.  Abdominal:     General: Bowel sounds are normal.     Palpations: Abdomen is soft.  Musculoskeletal:        General: Normal range of motion.     Cervical back: Normal range of motion and neck supple. No rigidity or tenderness.     Right lower leg: No edema.     Left lower leg: No edema.  Lymphadenopathy:     Cervical: No cervical adenopathy.  Skin:    General: Skin is dry.     Findings: No rash.  Neurological:     Mental Status: She is alert and oriented to person, place, and time. Mental status is at baseline.  Psychiatric:        Attention and Perception: Attention and perception normal.        Mood and Affect: Mood normal.        Speech: Speech normal.        Behavior: Behavior normal.        Thought Content: Thought content normal. Thought content does not include homicidal or  suicidal ideation. Thought content does not include homicidal or suicidal plan.        Cognition and Memory: Cognition and memory normal.        Judgment: Judgment normal.     Last CBC Lab Results  Component Value Date   WBC 6.9 10/19/2008   HGB 12.5 10/19/2008   HCT 37.3 10/19/2008   MCV 80.2 10/19/2008   RDW 16.7 (H) 10/19/2008   PLT 308 10/19/2008   Last metabolic panel Lab Results  Component Value Date   GLUCOSE 114 (H) 04/22/2014   NA 140 04/22/2014   K 4.5 04/22/2014   CL 97 04/22/2014   CO2 29 04/22/2014   BUN 14 04/22/2014   CREATININE 0.71 04/22/2014   GFRNONAA 95 04/22/2014   CALCIUM 9.2 04/22/2014       Assessment & Plan:  Postmenopausal estrogen deficiency -     DG [facility] DEXA; Future  Primary hypertension -     CBC with Differential/Platelet -     Losartan Potassium-HCTZ; Take 1 tablet by mouth daily.  Dispense: 90 tablet; Refill: 0  Anxiety associated with depression -     Escitalopram Oxalate; Take 1 tablet (10 mg total) by mouth daily.  Dispense: 90 tablet; Refill: 0  Polydipsia -     Bayer DCA Hb A1c Waived  Class 1 obesity due to excess calories without serious comorbidity with body mass index (BMI) of 31.0 to 31.9 in adult  Severe episode of recurrent major depressive disorder, without psychotic features (HCC) -     Escitalopram Oxalate; Take 1 tablet (10 mg total) by mouth daily.  Dispense: 90 tablet; Refill: 0  Encounter for general adult medical examination with abnormal findings -     CBC with Differential/Platelet -     CMP14+EGFR -     Lipid panel -     Thyroid Panel With TSH  Family history of diabetes mellitus in father  Family history of early CAD  Judith Stokes is a 67 year old female, no acute distress Polydipsia: Order A1c to rule out diabetes Depression/anxiety: Restart Lexapro 10 mg daily Hypertension: Hyzaar 50-12.5 mg daily: Keep blood pressure log twice daily, 1 week nurse  visit for BP recheck Labs: CBC, CMP, lipid,  TSH Obesity: BMI of 31.94 client need agreed to work on diet and exercise DEXA scan ordered, client to meet mammogram follow-up at checkout All question answered Encourage healthy lifestyle choices, including diet (rich in fruits, vegetables, and lean proteins, and low in salt and simple carbohydrates) and exercise (at least 30 minutes of moderate physical activity daily).     The above assessment and management plan was discussed with the patient. The patient verbalized understanding of and has agreed to the management plan. Patient is aware to call the clinic if they develop any new symptoms or if symptoms persist or worsen. Patient is aware when to return to the clinic for a follow-up visit. Patient educated on when it is appropriate to go to the emergency department.   Return in about 2 months (around 02/02/2023) for mental health/HTN.   Arrie Aran Santa Lighter, DNP [facility] [address] [place], [place] [address] [phone]

## 2022-12-04 ENCOUNTER — Other Ambulatory Visit: Payer: Self-pay | Admitting: Nurse Practitioner

## 2022-12-04 LAB — CBC WITH DIFFERENTIAL/PLATELET
Basophils Absolute: 0.1 10*3/uL (ref 0.0–0.2)
Basos: 1 %
EOS (ABSOLUTE): 0.1 10*3/uL (ref 0.0–0.4)
Eos: 4 %
Hematocrit: 36.2 % (ref 34.0–46.6)
Hemoglobin: 10.8 g/dL — ABNORMAL LOW (ref 11.1–15.9)
Immature Grans (Abs): 0 10*3/uL (ref 0.0–0.1)
Immature Granulocytes: 0 %
Lymphocytes Absolute: 1.1 10*3/uL (ref 0.7–3.1)
Lymphs: 30 %
MCH: 25.3 pg — ABNORMAL LOW (ref 26.6–33.0)
MCHC: 29.8 g/dL — ABNORMAL LOW (ref 31.5–35.7)
MCV: 85 fL (ref 79–97)
Monocytes Absolute: 0.4 10*3/uL (ref 0.1–0.9)
Monocytes: 12 %
Neutrophils Absolute: 1.8 10*3/uL (ref 1.4–7.0)
Neutrophils: 53 %
Platelets: 326 10*3/uL (ref 150–450)
RBC: 4.27 x10E6/uL (ref 3.77–5.28)
RDW: 15.6 % — ABNORMAL HIGH (ref 11.7–15.4)
WBC: 3.5 10*3/uL (ref 3.4–10.8)

## 2022-12-04 LAB — CMP14+EGFR
ALT: 9 [IU]/L (ref 0–32)
AST: 14 [IU]/L (ref 0–40)
Albumin: 4.1 g/dL (ref 3.9–4.9)
Alkaline Phosphatase: 104 [IU]/L (ref 44–121)
BUN/Creatinine Ratio: 22 (ref 12–28)
BUN: 13 mg/dL (ref 8–27)
Bilirubin Total: 0.3 mg/dL (ref 0.0–1.2)
CO2: 27 mmol/L (ref 20–29)
Calcium: 9.2 mg/dL (ref 8.7–10.3)
Chloride: 102 mmol/L (ref 96–106)
Creatinine, Ser: 0.58 mg/dL (ref 0.57–1.00)
Globulin, Total: 2.2 g/dL (ref 1.5–4.5)
Glucose: 84 mg/dL (ref 70–99)
Potassium: 4.3 mmol/L (ref 3.5–5.2)
Sodium: 144 mmol/L (ref 134–144)
Total Protein: 6.3 g/dL (ref 6.0–8.5)
eGFR: 100 mL/min/{1.73_m2} (ref >59)

## 2022-12-04 LAB — THYROID PANEL WITH TSH
Free Thyroxine Index: 1.4 (ref 1.2–4.9)
T3 Uptake Ratio: 28 % (ref 24–39)
T4, Total: 4.9 ug/dL (ref 4.5–12.0)
TSH: 2.71 u[IU]/mL (ref 0.450–4.500)

## 2022-12-04 LAB — LIPID PANEL
Chol/HDL Ratio: 1.8 ratio (ref 0.0–4.4)
Cholesterol, Total: 207 mg/dL — ABNORMAL HIGH (ref 100–199)
HDL: 116 mg/dL (ref >39)
LDL Chol Calc (NIH): 68 mg/dL (ref 0–99)
Triglycerides: 140 mg/dL (ref 0–149)
VLDL Cholesterol Cal: 23 mg/dL (ref 5–40)

## 2022-12-04 MED ORDER — TRAZODONE HCL 50 MG PO TABS: 50.0000 mg | ORAL_TABLET | Freq: Every evening | ORAL | 0 refills | Status: DC | PRN

## 2022-12-04 MED ORDER — FERROUS SULFATE 325 (65 FE) MG PO TABS: 325.0000 mg | ORAL_TABLET | Freq: Every day | ORAL | 0 refills | Status: DC

## 2022-12-04 NOTE — Progress Notes (Signed)
Call client discussed recent lab results with her Hemoglobin is low which is a sign of iron deficiency anemia which client reports having chronic issue we will start her on iron supplement. She has been taking Benadryl at night for sleep we discussed deseeding Benadryl and starting her on trazodone.  She agrees Prescription for iron supplement and Benadryl sent to her pharmacy

## 2022-12-05 ENCOUNTER — Other Ambulatory Visit: Payer: Self-pay

## 2022-12-05 ENCOUNTER — Telehealth: Payer: Self-pay | Admitting: Family Medicine

## 2022-12-05 MED ORDER — TRAZODONE HCL 50 MG PO TABS: 50.0000 mg | ORAL_TABLET | Freq: Every evening | ORAL | 0 refills | Status: DC | PRN

## 2022-12-05 MED ORDER — FERROUS SULFATE 325 (65 FE) MG PO TABS: 325.0000 mg | ORAL_TABLET | Freq: Every day | ORAL | 0 refills | Status: DC

## 2022-12-05 NOTE — Telephone Encounter (Signed)
Prescriptions sent to correct pharmacy pt aware and verbalized understanding

## 2022-12-05 NOTE — Telephone Encounter (Signed)
Copied from CRM [id number]. Topic: Clinical - Prescription Issue >> Dec 05, 2022  3:19 PM Theodis Sato wrote: Reason for CRM: The following prescriptions were sent to the incorrect pharmacy traZODone (DESYREL) 50 MG tablet and ferrous sulfate 325 (65 FE) MG tablet. Please send the prescriptions to [id number] - [place],  - [address]  Phone: [phone] Fax: [phone]

## 2022-12-10 ENCOUNTER — Ambulatory Visit: Payer: Medicare HMO

## 2022-12-10 DIAGNOSIS — I10 Essential (primary) hypertension: Secondary | ICD-10-CM

## 2022-12-10 NOTE — Progress Notes (Signed)
Patient came in for BP check - reading in office was 140/74 heart rate 82.  Patient has been checking at home,   Patient states that she is not able to take the iron pills -  constipation and nausea - patient stopped taking on Sunday.  Patient also states that she feels like the BP med is not doing enough. Patient states that she has taken lisinopril in the past and it has worked better for her. She states that she   Please  review and advise.  Per patient best to call after 3pm

## 2022-12-30 ENCOUNTER — Other Ambulatory Visit: Payer: Self-pay | Admitting: Nurse Practitioner

## 2022-12-30 ENCOUNTER — Telehealth: Payer: Self-pay | Admitting: Family Medicine

## 2022-12-30 DIAGNOSIS — I10 Essential (primary) hypertension: Secondary | ICD-10-CM

## 2022-12-30 MED ORDER — METOPROLOL TARTRATE 25 MG PO TABS: 25.0000 mg | ORAL_TABLET | Freq: Two times a day (BID) | ORAL | 0 refills | Status: DC

## 2022-12-30 NOTE — Telephone Encounter (Signed)
Pt aware and verbalized understanding. New prescription sent to [facility] per pt request

## 2022-12-30 NOTE — Progress Notes (Signed)
D/c Hyzaar and start her on metoprolol tartrate (LOPRESSOR) 25 MG tablet BID

## 2022-12-30 NOTE — Telephone Encounter (Signed)
Copied from CRM [id number]. Topic: Clinical - Medical Advice >> Dec 30, 2022 10:25 AM Judith Stokes wrote: Patient/patient representative is calling to schedule an appointment, but no afternoon appts available for her. She stated that her BP has been about 198/104 for the last 3 weeks and the med losartan-hydrochlorothiazide (HYZAAR) 50-12.5 MG tablet that was prescribed seems to be causing it. Looking to speak to the nurse or dr about changing the medication, if possible.

## 2023-01-01 DIAGNOSIS — Z03818 Encounter for observation for suspected exposure to other biological agents ruled out: Secondary | ICD-10-CM | POA: Diagnosis not present

## 2023-01-01 DIAGNOSIS — J069 Acute upper respiratory infection, unspecified: Secondary | ICD-10-CM | POA: Diagnosis not present

## 2023-01-01 DIAGNOSIS — R110 Nausea: Secondary | ICD-10-CM | POA: Diagnosis not present

## 2023-01-01 DIAGNOSIS — R051 Acute cough: Secondary | ICD-10-CM | POA: Diagnosis not present

## 2023-01-02 ENCOUNTER — Ambulatory Visit: Payer: Medicare HMO | Admitting: Family Medicine

## 2023-01-02 ENCOUNTER — Encounter: Payer: Self-pay | Admitting: Family Medicine

## 2023-01-02 ENCOUNTER — Telehealth (INDEPENDENT_AMBULATORY_CARE_PROVIDER_SITE_OTHER): Payer: Medicare HMO | Admitting: Family Medicine

## 2023-01-02 NOTE — Progress Notes (Signed)
Patient was contacted and said she did not need the video visit today she went over to urgent care and got treated yesterday.

## 2023-01-09 ENCOUNTER — Telehealth: Payer: Medicare HMO | Admitting: Nurse Practitioner

## 2023-01-09 ENCOUNTER — Encounter: Payer: Self-pay | Admitting: Nurse Practitioner

## 2023-01-09 ENCOUNTER — Ambulatory Visit: Payer: Self-pay | Admitting: Nurse Practitioner

## 2023-01-09 VITALS — BP 121/63 | HR 71 | Temp 97.5°F | Ht 61.5 in | Wt 171.0 lb

## 2023-01-09 DIAGNOSIS — I10 Essential (primary) hypertension: Secondary | ICD-10-CM | POA: Diagnosis not present

## 2023-01-09 MED ORDER — LISINOPRIL 10 MG PO TABS: 10.0000 mg | ORAL_TABLET | Freq: Every day | ORAL | 0 refills | Status: DC

## 2023-01-09 NOTE — Progress Notes (Addendum)
Virtual Visit via video Note Due to COVID-19 pandemic this visit was conducted virtually. This visit type was conducted due to national recommendations for restrictions regarding the COVID-19 Pandemic (e.g. social distancing, sheltering in place) in an effort to limit this patient's exposure and mitigate transmission in our community. All issues noted in this document were discussed and addressed.  A physical exam was not performed with this format.   I connected with Judith Stokes on 01/09/2023 at 1355 by name and DOB and verified that I am speaking with the correct person using two identifiers. Judith Stokes is currently located at the office in an exam room during visit. The provider, Martina Sinner, DNP is located in their home at time of visit.  I discussed the limitations, risks, security and privacy concerns of performing an evaluation and management service by virtual visit and the availability of in person appointments. I also discussed with the patient that there may be a patient responsible charge related to this service. The patient expressed understanding and agreed to proceed.  Subjective:  Patient ID: Judith Stokes, female    DOB: [birth year], 67 y.o.   MRN: [id number]  Chief Complaint:  Hypertension ("BP at home this morning 197/104")   HPI: Judith Stokes is a 67 y.o. female presenting via video from the clinic on 01/09/2023 for Hypertension ("BP at home this morning 197/104") When he was scheduled for virtual visit from her house today however she was not able to walk the [place] of the visit and was advised to come to the clinic for video visit and also to have a blood pressure check since her main concerns was high blood pressure.  She will be seen via video while in the room at the clinic. HTN. She had a nurse visit 12/10/2022 for BP check BP was 140/74, Hyzaar was d/c and she was started on Metoprolol BID. Her wrist BP cuff reading  was compared to the office  reading and the reading was similar. Reports took BP morning it was 197/104, and she has not take her medication today. Office BP is 121/63. She reports increase tiredness with Metoprolol, and reports great efficacy with Lisinopril in the past. Denies chest pain, SOB, headache, lightheadedness or syncope  Relevant past medical, surgical, family, and social history reviewed and updated as indicated.  Allergies and medications reviewed and updated.   Past Medical History:  Diagnosis Date   History of blood clots     Past Surgical History:  Procedure Laterality Date   ABDOMINAL HYSTERECTOMY     GASTRIC BYPASS     HAND TENDON SURGERY Left    TONSILLECTOMY     TUBAL LIGATION      Social History   Socioeconomic History   Marital status: Married    Spouse name: Not on file   Number of children: Not on file   Years of education: Not on file   Highest education level: Not on file  Occupational History   Not on file  Tobacco Use   Smoking status: Never   Smokeless tobacco: Not on file  Substance and Sexual Activity   Alcohol use: No    Alcohol/week: 0.0 standard drinks of alcohol   Drug use: No   Sexual activity: Not on file  Other Topics Concern   Not on file  Social History Narrative   Not on file   Social Drivers of Health   Financial Resource Strain: Low Risk  (12/03/2022)   Overall  Financial Resource Strain (CARDIA)    Difficulty of Paying Living Expenses: Not hard at all  Food Insecurity: No Food Insecurity (12/03/2022)   Hunger Vital Sign    Worried About Running Out of Food in the Last Year: Never true    Ran Out of Food in the Last Year: Never true  Transportation Needs: No Transportation Needs (12/03/2022)   PRAPARE - Administrator, Civil Service (Medical): No    Lack of Transportation (Non-Medical): No  Physical Activity: Not on file  Stress: Not on file  Social Connections: Not on file  Intimate Partner Violence: Not At Risk (12/03/2022)    Humiliation, Afraid, Rape, and Kick questionnaire    Fear of Current or Ex-Partner: No    Emotionally Abused: No    Physically Abused: No    Sexually Abused: No    Outpatient Encounter Medications as of 01/09/2023  Medication Sig   lisinopril (ZESTRIL) 10 MG tablet Take 1 tablet (10 mg total) by mouth daily.   acetaminophen (TYLENOL) 325 MG tablet Take 650 mg by mouth every 6 (six) hours as needed.   escitalopram (LEXAPRO) 10 MG tablet Take 1 tablet (10 mg total) by mouth daily.   ferrous sulfate 325 (65 FE) MG tablet Take 1 tablet (325 mg total) by mouth daily with breakfast.   magnesium 30 MG tablet Take 30 mg by mouth 2 (two) times daily.   traZODone (DESYREL) 50 MG tablet Take 1 tablet (50 mg total) by mouth at bedtime as needed for sleep.   [DISCONTINUED] losartan-hydrochlorothiazide (HYZAAR) 50-12.5 MG tablet Take 1 tablet by mouth daily.   [DISCONTINUED] metoprolol tartrate (LOPRESSOR) 25 MG tablet Take 1 tablet (25 mg total) by mouth 2 (two) times daily.   No facility-administered encounter medications on file as of 01/09/2023.    Allergies  Allergen Reactions   Oxycontin [Oxycodone Hcl] Hives   Penicillins Hives    Review of Systems  Constitutional:  Positive for fatigue. Negative for activity change and chills.  HENT:  Negative for congestion and rhinorrhea.   Respiratory:  Negative for chest tightness and wheezing.   Cardiovascular:  Negative for chest pain and palpitations.  Gastrointestinal:  Negative for diarrhea, nausea and vomiting.  Skin:  Negative for rash.  Neurological:  Negative for dizziness, syncope, light-headedness and headaches.    Observations/Objective: This was a [employer].  Pt alert and oriented, answers all questions appropriately, and able to speak in full sentences.   Today's Vitals   01/09/23 1352  BP: 121/63  Pulse: 71  Temp: (!) 97.5 F (36.4 C)  SpO2: 93%  Weight: 171 lb (77.6 kg)  Height: 5' 1.5" (1.562 m)   Body  mass index is 31.79 kg/m.  Assessment and Plan: [name] was seen today for hypertension.  Diagnoses and all orders for this visit:  Primary hypertension -     lisinopril (ZESTRIL) 10 MG tablet; Take 1 tablet (10 mg total) by mouth daily.   Judith Stokes is a 67 year old Caucasian female seen today via video visit for hypertension, no acute distress Client will continue checking BP twice a day at home BP log sheet provided; she had an appointment on 01/16/23 for nurse visit for BP check Will DC metoprolol and start client on lisinopril 10 mg  Encourage healthy lifestyle choices, including diet (rich in fruits, vegetables, and lean proteins, and low in salt and simple carbohydrates) and exercise (at least 30 minutes of moderate physical activity daily).     The above  assessment and management plan was discussed with the patient. The patient verbalized understanding of and has agreed to the management plan. Patient is aware to call the clinic if they develop any new symptoms or if symptoms persist or worsen. Patient is aware when to return to the clinic for a follow-up visit. Patient educated on when it is appropriate to go to the emergency department.  Goal BP:  For patients younger than 60: Goal BP < 140/90. For patients 60 and older: Goal BP < 150/90. For patients with diabetes: Goal BP < 140/90.  Take your medications faithfully as prescribed. Maintain a healthy weight. Get at least 150 minutes of aerobic exercise per week. Minimize salt intake, less than 2000 mg per day. Minimize alcohol intake.  DASH Eating Plan DASH stands for "Dietary Approaches to Stop Hypertension." The DASH eating plan is a healthy eating plan that has been shown to reduce high blood pressure (hypertension). Additional health benefits may include reducing the risk of type 2 diabetes mellitus, heart disease, and stroke. The DASH eating plan may also help with weight loss.  WHAT DO I NEED TO KNOW ABOUT THE DASH EATING  PLAN? For the DASH eating plan, you will follow these general guidelines: Choose foods with a percent daily value for sodium of less than 5% (as listed on the food label). Use salt-free seasonings or herbs instead of table salt or sea salt. Check with your health care provider or pharmacist before using salt substitutes. Eat lower-sodium products, often labeled as "lower sodium" or "no salt added." Eat fresh foods. Eat more vegetables, fruits, and low-fat dairy products. Choose whole grains. Look for the word "whole" as the first word in the ingredient list. Choose fish and skinless chicken or [place] more often than red meat. Limit fish, poultry, and meat to 6 oz (170 g) each day. Limit sweets, desserts, sugars, and sugary drinks. Choose heart-healthy fats. Limit cheese to 1 oz (28 g) per day. Eat more home-cooked food and less restaurant, buffet, and fast food. Limit fried foods. Cook foods using methods other than frying. Limit canned vegetables. If you do use them, rinse them well to decrease the sodium. When eating at a restaurant, ask that your food be prepared with less salt, or no salt if possible.  WHAT FOODS CAN I EAT? Seek help from a dietitian for individual calorie needs.  Grains Whole grain or whole wheat bread. Brown rice. Whole grain or whole wheat pasta. Quinoa, bulgur, and whole grain cereals. Low-sodium cereals. Corn or whole wheat flour tortillas. Whole grain cornbread. Whole grain crackers. Low-sodium crackers.  Vegetables Fresh or frozen vegetables (raw, steamed, roasted, or grilled). Low-sodium or reduced-sodium tomato and vegetable juices. Low-sodium or reduced-sodium tomato sauce and paste. Low-sodium or reduced-sodium canned vegetables.   Fruits All fresh, canned (in natural juice), or frozen fruits.  Meat and Other Protein Products Ground beef (85% or leaner), grass-fed beef, or beef trimmed of fat. Skinless chicken or [place]. Ground chicken or [place]. Pork  trimmed of fat. All fish and seafood. Eggs. Dried beans, peas, or lentils. Unsalted nuts and seeds. Unsalted canned beans.  Dairy Low-fat dairy products, such as skim or 1% milk, 2% or reduced-fat cheeses, low-fat ricotta or cottage cheese, or plain low-fat yogurt. Low-sodium or reduced-sodium cheeses.  Fats and Oils Tub margarines without trans fats. Light or reduced-fat mayonnaise and salad dressings (reduced sodium). Avocado. Safflower, olive, or canola oils. Natural peanut or almond butter.  Other Unsalted popcorn and pretzels. The items listed  above may not be a complete list of recommended foods or beverages. Contact your dietitian for more options.  WHAT FOODS ARE NOT RECOMMENDED?  Grains White bread. White pasta. White rice. Refined cornbread. Bagels and croissants. Crackers that contain trans fat.  Vegetables Creamed or fried vegetables. Vegetables in a cheese sauce. Regular canned vegetables. Regular canned tomato sauce and paste. Regular tomato and vegetable juices.  Fruits Dried fruits. Canned fruit in light or heavy syrup. Fruit juice.  Meat and Other Protein Products Fatty cuts of meat. Ribs, chicken wings, bacon, sausage, bologna, salami, chitterlings, fatback, hot dogs, bratwurst, and packaged luncheon meats. Salted nuts and seeds. Canned beans with salt.  Dairy Whole or 2% milk, cream, half-and-half, and cream cheese. Whole-fat or sweetened yogurt. Full-fat cheeses or blue cheese. Nondairy creamers and whipped toppings. Processed cheese, cheese spreads, or cheese curds.  Condiments Onion and garlic salt, seasoned salt, table salt, and sea salt. Canned and packaged gravies. Worcestershire sauce. Tartar sauce. Barbecue sauce. Teriyaki sauce. Soy sauce, including reduced sodium. Steak sauce. Fish sauce. Oyster sauce. Cocktail sauce. Horseradish. Ketchup and mustard. Meat flavorings and tenderizers. Bouillon cubes. Hot sauce. Tabasco sauce. Marinades. Taco seasonings.  Relishes.  Fats and Oils Butter, stick margarine, lard, shortening, ghee, and bacon fat. Coconut, palm kernel, or palm oils. Regular salad dressings.  Other Pickles and olives. Salted popcorn and pretzels.  The items listed above may not be a complete list of foods and beverages to avoid. Contact your dietitian for more information.  WHERE CAN I FIND MORE INFORMATION? National Heart, Lung, and Blood Institute: [employer] Document Released: 12/27/2010 Document Revised: 05/24/2013 Document Reviewed: 11/11/2012 [facility] Patient Information 2015 [place], [place]. This information is not intended to replace advice given to you by your health care provider. Make sure you discuss any questions you have with your health care provider.   I think that you would greatly benefit from seeing a nutritionist.  If you are interested, please call [address] [place], [place] [phone] to schedule an appointment.   Follow Up Instructions: Return for As already scheduled.    I discussed the assessment and treatment plan with the patient. The patient was provided an opportunity to ask questions and all were answered. The patient agreed with the plan and demonstrated an understanding of the instructions.   The patient was advised to call back or seek an in-person evaluation if the symptoms worsen or if the condition fails to improve as anticipated.  The above assessment and management plan was discussed with the patient. The patient verbalized understanding of and has agreed to the management plan. Patient is aware to call the clinic if they develop any new symptoms or if symptoms persist or worsen. Patient is aware when to return to the clinic for a follow-up visit. Patient educated on when it is appropriate to go to the emergency department.    I provided 15 minutes of time during this video encounter.   Arrie Aran [place], DNP [facility]  Medicine [address] [place], [place] [address] [phone] 01/09/2023

## 2023-01-09 NOTE — Patient Instructions (Signed)
Goal BP:  For patients younger than 60: Goal BP < 140/90. For patients 60 and older: Goal BP < 150/90. For patients with diabetes: Goal BP < 140/90.  Take your medications faithfully as prescribed. Maintain a healthy weight. Get at least 150 minutes of aerobic exercise per week. Minimize salt intake, less than 2000 mg per day. Minimize alcohol intake.  DASH Eating Plan DASH stands for "Dietary Approaches to Stop Hypertension." The DASH eating plan is a healthy eating plan that has been shown to reduce high blood pressure (hypertension). Additional health benefits may include reducing the risk of type 2 diabetes mellitus, heart disease, and stroke. The DASH eating plan may also help with weight loss.  WHAT DO I NEED TO KNOW ABOUT THE DASH EATING PLAN? For the DASH eating plan, you will follow these general guidelines: Choose foods with a percent daily value for sodium of less than 5% (as listed on the food label). Use salt-free seasonings or herbs instead of table salt or sea salt. Check with your health care provider or pharmacist before using salt substitutes. Eat lower-sodium products, often labeled as "lower sodium" or "no salt added." Eat fresh foods. Eat more vegetables, fruits, and low-fat dairy products. Choose whole grains. Look for the word "whole" as the first word in the ingredient list. Choose fish and skinless chicken or [place] more often than red meat. Limit fish, poultry, and meat to 6 oz (170 g) each day. Limit sweets, desserts, sugars, and sugary drinks. Choose heart-healthy fats. Limit cheese to 1 oz (28 g) per day. Eat more home-cooked food and less restaurant, buffet, and fast food. Limit fried foods. Cook foods using methods other than frying. Limit canned vegetables. If you do use them, rinse them well to decrease the sodium. When eating at a restaurant, ask that your food be prepared with less salt, or no salt if possible.  WHAT FOODS CAN I EAT? Seek help from  a dietitian for individual calorie needs.  Grains Whole grain or whole wheat bread. Brown rice. Whole grain or whole wheat pasta. Quinoa, bulgur, and whole grain cereals. Low-sodium cereals. Corn or whole wheat flour tortillas. Whole grain cornbread. Whole grain crackers. Low-sodium crackers.  Vegetables Fresh or frozen vegetables (raw, steamed, roasted, or grilled). Low-sodium or reduced-sodium tomato and vegetable juices. Low-sodium or reduced-sodium tomato sauce and paste. Low-sodium or reduced-sodium canned vegetables.   Fruits All fresh, canned (in natural juice), or frozen fruits.  Meat and Other Protein Products Ground beef (85% or leaner), grass-fed beef, or beef trimmed of fat. Skinless chicken or [place]. Ground chicken or [place]. Pork trimmed of fat. All fish and seafood. Eggs. Dried beans, peas, or lentils. Unsalted nuts and seeds. Unsalted canned beans.  Dairy Low-fat dairy products, such as skim or 1% milk, 2% or reduced-fat cheeses, low-fat ricotta or cottage cheese, or plain low-fat yogurt. Low-sodium or reduced-sodium cheeses.  Fats and Oils Tub margarines without trans fats. Light or reduced-fat mayonnaise and salad dressings (reduced sodium). Avocado. Safflower, olive, or canola oils. Natural peanut or almond butter.  Other Unsalted popcorn and pretzels. The items listed above may not be a complete list of recommended foods or beverages. Contact your dietitian for more options.  WHAT FOODS ARE NOT RECOMMENDED?  Grains White bread. White pasta. White rice. Refined cornbread. Bagels and croissants. Crackers that contain trans fat.  Vegetables Creamed or fried vegetables. Vegetables in a cheese sauce. Regular canned vegetables. Regular canned tomato sauce and paste. Regular tomato and vegetable juices.  Fruits Dried fruits. Canned fruit in light or heavy syrup. Fruit juice.  Meat and Other Protein Products Fatty cuts of meat. Ribs, chicken wings, bacon, sausage,  bologna, salami, chitterlings, fatback, hot dogs, bratwurst, and packaged luncheon meats. Salted nuts and seeds. Canned beans with salt.  Dairy Whole or 2% milk, cream, half-and-half, and cream cheese. Whole-fat or sweetened yogurt. Full-fat cheeses or blue cheese. Nondairy creamers and whipped toppings. Processed cheese, cheese spreads, or cheese curds.  Condiments Onion and garlic salt, seasoned salt, table salt, and sea salt. Canned and packaged gravies. Worcestershire sauce. Tartar sauce. Barbecue sauce. Teriyaki sauce. Soy sauce, including reduced sodium. Steak sauce. Fish sauce. Oyster sauce. Cocktail sauce. Horseradish. Ketchup and mustard. Meat flavorings and tenderizers. Bouillon cubes. Hot sauce. Tabasco sauce. Marinades. Taco seasonings. Relishes.  Fats and Oils Butter, stick margarine, lard, shortening, ghee, and bacon fat. Coconut, palm kernel, or palm oils. Regular salad dressings.  Other Pickles and olives. Salted popcorn and pretzels.  The items listed above may not be a complete list of foods and beverages to avoid. Contact your dietitian for more information.  WHERE CAN I FIND MORE INFORMATION? National Heart, Lung, and Blood Institute: [employer] Document Released: 12/27/2010 Document Revised: 05/24/2013 Document Reviewed: 11/11/2012 [facility] Patient Information 2015 [place], [place]. This information is not intended to replace advice given to you by your health care provider. Make sure you discuss any questions you have with your health care provider.   I think that you would greatly benefit from seeing a nutritionist.  If you are interested, please call [address] [place], [place] [phone] to schedule an appointment.

## 2023-01-09 NOTE — Telephone Encounter (Signed)
Copied from CRM [id number]. Topic: Clinical - Pink Word Triage >> Jan 09, 2023  9:21 AM Geroge Baseman wrote: Reason for Triage: Patient states extremely high blood pressure and light headedness, transferred to triage nurse    Chief Complaint: high blood pressure Symptoms: no symptoms Frequency:  Pertinent Negatives: Patient denies headache, chest pain, blurred vision Disposition: [] ED /[] Urgent Care (no appt availability in office) / [x] Appointment(In office/virtual)/ []  [place] [facility]/ [] Home Care/ [] Refused Recommended Disposition /[] [facility]/ []  Follow-up with PCP Additional Notes: Pt reports high BP readings for several days. Sts that she started taking Hyzaar and her BP was still high. Pt was then started on Metoprolol but sts that her BP was still high. Pt denies any chest pain, headaches, or blurred vision, but sts that she has no energy. Next available appt is 12/26, RN offered to schedule an earlier appt at another office, but she is unable to make the drive due to her work schedule. RN advised pt to go to Urgent Care, but patient became very upset due to the cost of urgent care visits on top of medication. Pt expressed her frustration about not being able to see her provider when she needs to and states "I'd rather just have a stroke and die". Pt expressed several financial and social frustration and stated that December is very hard for her. RN provided listening support and offered words of encouragement, pt was receptive. Appt scheduled for 01/16/23 at 1pm, pt states that she will not take her BP meds today to see if it helps her BP. RN advised against that plan, but patient is adamant and believes the BP meds are the actual source of her BP being high. Pt sts that she used to take Lisinopril 10mg  and her BP was better controlled and she is unsure why she can't take that instead. Pt sts that she will further discuss new medications at her appt.    Reason for Disposition   Systolic BP  >= 160 OR Diastolic >= 100  Answer Assessment - Initial Assessment Questions 1. BLOOD PRESSURE: "What is the blood pressure?" "Did you take at least two measurements 5 minutes apart?"     0915-198/108, second reading 0932 was 156/86  2. ONSET: "When did you take your blood pressure?"     Took BP this morning  3. HOW: "How did you take your blood pressure?" (e.g., automatic home BP monitor, visiting nurse)     Home BP monitor  4. HISTORY: "Do you have a history of high blood pressure?"     Yes,   5. MEDICINES: "Are you taking any medicines for blood pressure?" "Have you missed any doses recently?"     Hyzaar 50-12.5mg  and Metoprolol 25mg   6. OTHER SYMPTOMS: "Do you have any symptoms?" (e.g., blurred vision, chest pain, difficulty breathing, headache, weakness)     Experienced blurred vision "the other day" while at work, but states that she was at work, working on [employer] and the heat may attributed; feels tired and has low energy.  Protocols used: Blood Pressure - High-A-AH

## 2023-01-09 NOTE — Telephone Encounter (Signed)
Made appt with sandra today

## 2023-01-16 ENCOUNTER — Encounter: Payer: Self-pay | Admitting: Nurse Practitioner

## 2023-01-16 ENCOUNTER — Ambulatory Visit: Payer: Medicare HMO | Admitting: Nurse Practitioner

## 2023-01-16 VITALS — BP 130/80 | HR 69 | Temp 97.6°F | Ht 61.5 in | Wt 172.8 lb

## 2023-01-16 DIAGNOSIS — F418 Other specified anxiety disorders: Secondary | ICD-10-CM | POA: Diagnosis not present

## 2023-01-16 DIAGNOSIS — M79604 Pain in right leg: Secondary | ICD-10-CM | POA: Diagnosis not present

## 2023-01-16 DIAGNOSIS — I10 Essential (primary) hypertension: Secondary | ICD-10-CM

## 2023-01-16 DIAGNOSIS — M79605 Pain in left leg: Secondary | ICD-10-CM

## 2023-01-16 MED ORDER — LISINOPRIL 20 MG PO TABS: 20.0000 mg | ORAL_TABLET | Freq: Every day | ORAL | 0 refills | Status: DC

## 2023-01-16 MED ORDER — RISPERIDONE 0.25 MG PO TABS
0.2500 mg | ORAL_TABLET | Freq: Every day | ORAL | 0 refills | Status: DC
Start: 2023-01-16 — End: 2023-04-14

## 2023-01-16 MED ORDER — NAPROXEN 500 MG PO TABS: 500.0000 mg | ORAL_TABLET | Freq: Two times a day (BID) | ORAL | 1 refills | Status: DC | PRN

## 2023-01-16 NOTE — Progress Notes (Signed)
Established Patient Office Visit  Subjective  Patient ID: Judith Stokes, female    DOB: 27-Oct-1955  Age: 67 y.o. MRN: [id number]  Chief Complaint  Patient presents with   Hypertension    Blood pressure been 190s-200s/80s-90s    HPI  Judith Stokes 67 year old female present today for 1 week follow-up for hypertension and concern about leg pain and mood swings   Hypertension, follow-up  BP Readings from Last 3 Encounters:  01/16/23 130/80  01/09/23 121/63  12/03/22 (!) 174/97   Wt Readings from Last 3 Encounters:  01/16/23 172 lb 12.8 oz (78.4 kg)  01/09/23 171 lb (77.6 kg)  12/03/22 171 lb 12.8 oz (77.9 kg)     She was last seen for hypertension 1 weeks ago.  BP at that visit was 121/63. Management since that visit includes lisinopril 10 mg.  She reports excellent compliance with treatment. She is not having side effects.  She is following a Regular diet. She is not exercising. Use of agents associated with hypertension: none.  Outside blood pressures are using a wrist BP cuff 198-200/90-97 Symptoms: No chest pain No chest pressure  No palpitations No syncope  No dyspnea No orthopnea  No paroxysmal nocturnal dyspnea No lower extremity edema   Pertinent labs Lab Results  Component Value Date   CHOL 207 (H) 12/03/2022   HDL 116 12/03/2022   LDLCALC 68 12/03/2022   TRIG 140 12/03/2022   CHOLHDL 1.8 12/03/2022   Lab Results  Component Value Date   NA 144 12/03/2022   K 4.3 12/03/2022   CREATININE 0.58 12/03/2022   EGFR 100 12/03/2022   GLUCOSE 84 12/03/2022   TSH 2.710 12/03/2022     The ASCVD Risk score (Arnett DK, et al., 2019) failed to calculate for the following reasons:   The valid HDL cholesterol range is 20 to 100 mg/dL  New issue leg pain new presents with a new concern of bilateral leg pain that has been ongoing for several years but has worsened over the last month. The pain is described as constant and does not radiate. 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 The increased physical demands at work may be contributing to the worsening of symptoms. The patient is seeking evaluation for the leg pain and possible treatment options.   Anxiety and Depression:  Moved end of Nov and has not unpacked and feeling depressedbecause she is wtill not bale to fix hr house. Reports only getting 2-3 hrs of sleep dealing with racing thoughts "sometimes I woke because of dreams about things that I have being thinking before bed". Dealing with mood swing  New issue mood swings presents with an increase in mood swings since her recent move in November. She has a history of anxiety and depression and reports that her mood has been more volatile since the move. Additionally, she mentions significant sleep disturbances, stating that she is only able to get 2-3 hours of sleep per night despite being prescribed Trazodone to help with sleep. The patient is seeking assistance in managing her mood fluctuations and sleep issues, which have become more pronounced recently. Will d/c Trazodone and start Risperdal at bedtime.  Patient Active Problem List   Diagnosis Date Noted   Pain in both lower extremities 01/16/2023   Primary hypertension 12/03/2022   Postmenopausal estrogen deficiency 12/03/2022   Routine medical exam 12/03/2022  Encounter for general adult medical examination with abnormal findings 12/03/2022   Anxiety associated with depression 12/03/2022   Polydipsia 12/03/2022   Family history of diabetes mellitus in father 12/03/2022   Family history of early CAD 12/03/2022   Class 1 obesity due to excess calories without serious comorbidity with body mass index (BMI) of 31.0 to 31.9 in adult 12/03/2022   Depression 04/22/2014   Insomnia 04/22/2014   Past Medical History:  Diagnosis Date   History of blood clots    Past Surgical History:  Procedure  Laterality Date   ABDOMINAL HYSTERECTOMY     GASTRIC BYPASS     HAND TENDON SURGERY Left    TONSILLECTOMY     TUBAL LIGATION     Social History   Tobacco Use   Smoking status: Never  Substance Use Topics   Alcohol use: No    Alcohol/week: 0.0 standard drinks of alcohol   Drug use: No   Social History   Socioeconomic History   Marital status: Married    Spouse name: Not on file   Number of children: Not on file   Years of education: Not on file   Highest education level: Not on file  Occupational History   Not on file  Tobacco Use   Smoking status: Never   Smokeless tobacco: Not on file  Substance and Sexual Activity   Alcohol use: No    Alcohol/week: 0.0 standard drinks of alcohol   Drug use: No   Sexual activity: Not on file  Other Topics Concern   Not on file  Social History Narrative   Not on file   Social Drivers of Health   Financial Resource Strain: Low Risk  (12/03/2022)   Overall Financial Resource Strain (CARDIA)    Difficulty of Paying Living Expenses: Not hard at all  Food Insecurity: No Food Insecurity (12/03/2022)   Hunger Vital Sign    Worried About Running Out of Food in the Last Year: Never true    Ran Out of Food in the Last Year: Never true  Transportation Needs: No Transportation Needs (12/03/2022)   PRAPARE - Administrator, Civil Service (Medical): No    Lack of Transportation (Non-Medical): No  Physical Activity: Not on file  Stress: Not on file  Social Connections: Not on file  Intimate Partner Violence: Not At Risk (12/03/2022)   Humiliation, Afraid, Rape, and Kick questionnaire    Fear of Current or Ex-Partner: No    Emotionally Abused: No    Physically Abused: No    Sexually Abused: No   Family Status  Relation Name Status   Mother  Deceased   Father  Deceased   Sister  Alive   Brother  Alive  No partnership data on file   Family History  Problem Relation Age of Onset   Cancer Mother    COPD Father     Cancer Sister    Allergies  Allergen Reactions   Oxycontin [Oxycodone Hcl] Hives   Penicillins Hives      Review of Systems  Constitutional:  Negative for chills and fever.  HENT:  Negative for congestion and sore throat.   Respiratory:  Negative for cough and shortness of breath.   Cardiovascular:  Negative for chest pain and leg swelling.  Gastrointestinal:  Negative for constipation, diarrhea, nausea and vomiting.  Genitourinary:  Negative for frequency and hematuria.  Musculoskeletal:  Positive for joint pain.       Sharp 5/10 mostly at night  Leg pain   Skin:  Negative for itching and rash.  Neurological:  Negative for dizziness, weakness and headaches.  Endo/Heme/Allergies:  Negative for environmental allergies and polydipsia. Does not bruise/bleed easily.  Psychiatric/Behavioral:  Positive for depression. Negative for hallucinations, substance abuse and suicidal ideas. The patient has insomnia.    Negative unless indicated in HPI   Objective:     BP 130/80 Comment: left arm manual  Pulse 69   Temp 97.6 F (36.4 C) (Temporal)   Ht 5' 1.5" (1.562 m)   Wt 172 lb 12.8 oz (78.4 kg)   SpO2 100%   BMI 32.12 kg/m  BP Readings from Last 3 Encounters:  01/16/23 130/80  01/09/23 121/63  12/03/22 (!) 174/97   Wt Readings from Last 3 Encounters:  01/16/23 172 lb 12.8 oz (78.4 kg)  01/09/23 171 lb (77.6 kg)  12/03/22 171 lb 12.8 oz (77.9 kg)      Physical Exam Vitals and nursing note reviewed.  Constitutional:      Appearance: Normal appearance.  HENT:     Head: Normocephalic and atraumatic.     Nose: Nose normal.     Mouth/Throat:     Mouth: Mucous membranes are moist.  Eyes:     General: No scleral icterus.    Extraocular Movements: Extraocular movements intact.     Conjunctiva/sclera: Conjunctivae normal.     Pupils: Pupils are equal, round, and reactive to light.  Cardiovascular:     Rate and Rhythm: Normal rate and regular rhythm.  Pulmonary:     Effort:  Pulmonary effort is normal.     Breath sounds: Normal breath sounds.  Abdominal:     General: Bowel sounds are normal.     Palpations: Abdomen is soft.  Musculoskeletal:     Cervical back: Normal. Normal range of motion.     Thoracic back: Normal.     Lumbar back: Positive right straight leg raise test.     Right lower leg: No swelling. No edema.     Left lower leg: No swelling. No edema.  Skin:    General: Skin is warm and dry.     Findings: No rash.  Neurological:     Mental Status: She is alert and oriented to person, place, and time. Mental status is at baseline.  Psychiatric:        Attention and Perception: Attention and perception normal.        Mood and Affect: Mood and affect normal.        Speech: Speech normal.        Behavior: Behavior normal.        Thought Content: Thought content normal.        Cognition and Memory: Cognition and memory normal.        Judgment: Judgment normal.     No results found for any visits on 01/16/23.  Last CBC Lab Results  Component Value Date   WBC 3.5 12/03/2022   HGB 10.8 (L) 12/03/2022   HCT 36.2 12/03/2022   MCV 85 12/03/2022   MCH 25.3 (L) 12/03/2022   RDW 15.6 (H) 12/03/2022   PLT 326 12/03/2022   Last metabolic panel Lab Results  Component Value Date   GLUCOSE 84 12/03/2022   NA 144 12/03/2022   K 4.3 12/03/2022   CL 102 12/03/2022   CO2 27 12/03/2022   BUN 13 12/03/2022   CREATININE 0.58 12/03/2022   EGFR 100 12/03/2022   CALCIUM 9.2 12/03/2022   PROT 6.3  12/03/2022   ALBUMIN 4.1 12/03/2022   LABGLOB 2.2 12/03/2022   BILITOT 0.3 12/03/2022   ALKPHOS 104 12/03/2022   AST 14 12/03/2022   ALT 9 12/03/2022   Last lipids Lab Results  Component Value Date   CHOL 207 (H) 12/03/2022   HDL 116 12/03/2022   LDLCALC 68 12/03/2022   TRIG 140 12/03/2022   CHOLHDL 1.8 12/03/2022   Last hemoglobin A1c Lab Results  Component Value Date   HGBA1C 5.1 12/03/2022   Last thyroid functions Lab Results  Component  Value Date   TSH 2.710 12/03/2022   T4TOTAL 4.9 12/03/2022        Assessment & Plan:  Primary hypertension -     Lisinopril; Take 1 tablet (20 mg total) by mouth daily.  Dispense: 90 tablet; Refill: 0  Pain in both lower extremities -     Naproxen; Take 1 tablet (500 mg total) by mouth 2 (two) times daily as needed for moderate pain (pain score 4-6) (for leg pain, take with food).  Dispense: 60 tablet; Refill: 1  Anxiety associated with depression -     risperiDONE; Take 1 tablet (0.25 mg total) by mouth at bedtime.  Dispense: 90 tablet; Refill: 0   Judith Stokes is 42 yrs  caucasian female, no acute distress Leg pain: Naproxen 500 mg BID PRN, take with food MDD: continue Lexapro 10 mg daily Mood swings: D/c trazodone and start risperidone 0.5 mg nightly Hypertension: Increase lisinopril to 20 mg daily Goal BP:  For patients younger than 60: Goal BP < 140/90. For patients 60 and older: Goal BP < 150/90. For patients with diabetes: Goal BP < 140/90. Take your medications faithfully as prescribed. Maintain a healthy weight. Get at least 150 minutes of aerobic exercise per week. Minimize salt intake, less than 2000 mg per day. Minimize alcohol intake.  DASH Eating Plan DASH stands for "Dietary Approaches to Stop Hypertension." The DASH eating plan is a healthy eating plan that has been shown to reduce high blood pressure (hypertension). Additional health benefits may include reducing the risk of type 2 diabetes mellitus, heart disease, and stroke. The DASH eating plan may also help with weight loss.  WHAT DO I NEED TO KNOW ABOUT THE DASH EATING PLAN? For the DASH eating plan, you will follow these general guidelines: Choose foods with a percent daily value for sodium of less than 5% (as listed on the food label). Use salt-free seasonings or herbs instead of table salt or sea salt. Check with your health care provider or pharmacist before using salt substitutes. Eat lower-sodium  products, often labeled as "lower sodium" or "no salt added." Eat fresh foods. Eat more vegetables, fruits, and low-fat dairy products. Choose whole grains. Look for the word "whole" as the first word in the ingredient list. Choose fish and skinless chicken or [place] more often than red meat. Limit fish, poultry, and meat to 6 oz (170 g) each day. Limit sweets, desserts, sugars, and sugary drinks. Choose heart-healthy fats. Limit cheese to 1 oz (28 g) per day. Eat more home-cooked food and less restaurant, buffet, and fast food. Limit fried foods. Cook foods using methods other than frying. Limit canned vegetables. If you do use them, rinse them well to decrease the sodium. When eating at a restaurant, ask that your food be prepared with less salt, or no salt if possible.  WHAT FOODS CAN I EAT? Seek help from a dietitian for individual calorie needs.  Grains Whole grain or whole wheat  bread. Brown rice. Whole grain or whole wheat pasta. Quinoa, bulgur, and whole grain cereals. Low-sodium cereals. Corn or whole wheat flour tortillas. Whole grain cornbread. Whole grain crackers. Low-sodium crackers.  Vegetables Fresh or frozen vegetables (raw, steamed, roasted, or grilled). Low-sodium or reduced-sodium tomato and vegetable juices. Low-sodium or reduced-sodium tomato sauce and paste. Low-sodium or reduced-sodium canned vegetables.   Fruits All fresh, canned (in natural juice), or frozen fruits.  Meat and Other Protein Products Ground beef (85% or leaner), grass-fed beef, or beef trimmed of fat. Skinless chicken or [place]. Ground chicken or [place]. Pork trimmed of fat. All fish and seafood. Eggs. Dried beans, peas, or lentils. Unsalted nuts and seeds. Unsalted canned beans.  Dairy Low-fat dairy products, such as skim or 1% milk, 2% or reduced-fat cheeses, low-fat ricotta or cottage cheese, or plain low-fat yogurt. Low-sodium or reduced-sodium cheeses.  Fats and Oils Tub margarines  without trans fats. Light or reduced-fat mayonnaise and salad dressings (reduced sodium). Avocado. Safflower, olive, or canola oils. Natural peanut or almond butter.  Other Unsalted popcorn and pretzels. The items listed above may not be a complete list of recommended foods or beverages. Contact your dietitian for more options.  WHAT FOODS ARE NOT RECOMMENDED?  Grains White bread. White pasta. White rice. Refined cornbread. Bagels and croissants. Crackers that contain trans fat.  Vegetables Creamed or fried vegetables. Vegetables in a cheese sauce. Regular canned vegetables. Regular canned tomato sauce and paste. Regular tomato and vegetable juices.  Fruits Dried fruits. Canned fruit in light or heavy syrup. Fruit juice.  Meat and Other Protein Products Fatty cuts of meat. Ribs, chicken wings, bacon, sausage, bologna, salami, chitterlings, fatback, hot dogs, bratwurst, and packaged luncheon meats. Salted nuts and seeds. Canned beans with salt.  Dairy Whole or 2% milk, cream, half-and-half, and cream cheese. Whole-fat or sweetened yogurt. Full-fat cheeses or blue cheese. Nondairy creamers and whipped toppings. Processed cheese, cheese spreads, or cheese curds.  Condiments Onion and garlic salt, seasoned salt, table salt, and sea salt. Canned and packaged gravies. Worcestershire sauce. Tartar sauce. Barbecue sauce. Teriyaki sauce. Soy sauce, including reduced sodium. Steak sauce. Fish sauce. Oyster sauce. Cocktail sauce. Horseradish. Ketchup and mustard. Meat flavorings and tenderizers. Bouillon cubes. Hot sauce. Tabasco sauce. Marinades. Taco seasonings. Relishes.  Fats and Oils Butter, stick margarine, lard, shortening, ghee, and bacon fat. Coconut, palm kernel, or palm oils. Regular salad dressings.  Other Pickles and olives. Salted popcorn and pretzels.  The items listed above may not be a complete list of foods and beverages to avoid. Contact your dietitian for more  information.  WHERE CAN I FIND MORE INFORMATION? National Heart, [employer], and [employer] Institute: [employer] Document Released: 12/27/2010 Document Revised: 05/24/2013 Document Reviewed: 11/11/2012 [facility] Patient Information 2015 [place], [place]. This information is not intended to replace advice given to you by your health care provider. Make sure you discuss any questions you have with your health care provider.   I think that you would greatly benefit from seeing a nutritionist.  If you are interested, please call [address] [place], [place] [phone] to schedule an appointment.  Encourage healthy lifestyle choices, including diet (rich in fruits, vegetables, and lean proteins, and low in salt and simple carbohydrates) and exercise (at least 30 minutes of moderate physical activity daily).     The above assessment and management plan was discussed with the patient. The patient verbalized understanding of and has agreed to the management plan. Patient is aware to call the clinic if they  develop any new symptoms or if symptoms persist or worsen. Patient is aware when to return to the clinic for a follow-up visit. Patient educated on when it is appropriate to go to the emergency department.   Return in about 3 months (around 04/16/2023).    Arrie Aran [place], [place] [facility] [address] [place], [place] [address] [phone]    Note: This document was prepared by [employer] voice dictation technology and any errors that results from this process are unintentional.

## 2023-01-16 NOTE — Patient Instructions (Signed)

## 2023-01-20 ENCOUNTER — Other Ambulatory Visit: Payer: Self-pay | Admitting: Nurse Practitioner

## 2023-01-20 DIAGNOSIS — Z1231 Encounter for screening mammogram for malignant neoplasm of breast: Secondary | ICD-10-CM

## 2023-01-27 ENCOUNTER — Inpatient Hospital Stay: Admission: RE | Admit: 2023-01-27 | Payer: Self-pay | Source: Ambulatory Visit

## 2023-02-04 ENCOUNTER — Ambulatory Visit (INDEPENDENT_AMBULATORY_CARE_PROVIDER_SITE_OTHER): Payer: Medicare HMO

## 2023-02-04 VITALS — BP 99/48 | Ht 61.5 in | Wt 168.0 lb

## 2023-02-04 DIAGNOSIS — Z0000 Encounter for general adult medical examination without abnormal findings: Secondary | ICD-10-CM

## 2023-02-04 NOTE — Progress Notes (Signed)
 Subjective:   Judith Stokes is a 68 y.o. female who presents for an Initial Medicare Annual Wellness Visit.  Visit Complete: In person  Cardiac Risk Factors include: advanced age (>3men, >50 women);hypertension     Objective:    Today's Vitals   02/04/23 1556  BP: (!) 99/48  Weight: 168 lb (76.2 kg)  Height: 5' 1.5 (1.562 m)   Body mass index is 31.23 kg/m.     02/04/2023    4:08 PM  Advanced Directives  Does Patient Have a Medical Advance Directive? No  Would patient like information on creating a medical advance directive? Yes (MAU/Ambulatory/Procedural Areas - Information given)    Current Medications (verified) Outpatient Encounter Medications as of 02/04/2023  Medication Sig   escitalopram  (LEXAPRO ) 10 MG tablet Take 1 tablet (10 mg total) by mouth daily.   ferrous sulfate  325 (65 FE) MG tablet Take 1 tablet (325 mg total) by mouth daily with breakfast.   lisinopril  (ZESTRIL ) 20 MG tablet Take 1 tablet (20 mg total) by mouth daily.   magnesium 30 MG tablet Take 30 mg by mouth 2 (two) times daily.   naproxen  (NAPROSYN ) 500 MG tablet Take 1 tablet (500 mg total) by mouth 2 (two) times daily as needed for moderate pain (pain score 4-6) (for leg pain, take with food).   promethazine-dextromethorphan (PROMETHAZINE-DM) 6.25-15 MG/5ML syrup Take 5 mLs by mouth every 4 (four) hours as needed.   risperiDONE  (RISPERDAL ) 0.25 MG tablet Take 1 tablet (0.25 mg total) by mouth at bedtime.   No facility-administered encounter medications on file as of 02/04/2023.    Allergies (verified) Oxycontin  [oxycodone  hcl] and Penicillins   History: Past Medical History:  Diagnosis Date   History of blood clots    Past Surgical History:  Procedure Laterality Date   ABDOMINAL HYSTERECTOMY     GASTRIC BYPASS     HAND TENDON SURGERY Left    TONSILLECTOMY     TUBAL LIGATION     Family History  Problem Relation Age of Onset   Cancer Mother    COPD Father    Cancer Sister     Social History   Socioeconomic History   Marital status: Married    Spouse name: Not on file   Number of children: Not on file   Years of education: Not on file   Highest education level: Not on file  Occupational History   Not on file  Tobacco Use   Smoking status: Never   Smokeless tobacco: Not on file  Substance and Sexual Activity   Alcohol use: No    Alcohol/week: 0.0 standard drinks of alcohol   Drug use: No   Sexual activity: Not on file  Other Topics Concern   Not on file  Social History Narrative   Not on file   Social Drivers of Health   Financial Resource Strain: Low Risk  (02/04/2023)   Overall Financial Resource Strain (CARDIA)    Difficulty of Paying Living Expenses: Not hard at all  Food Insecurity: No Food Insecurity (02/04/2023)   Hunger Vital Sign    Worried About Running Out of Food in the Last Year: Never true    Ran Out of Food in the Last Year: Never true  Transportation Needs: No Transportation Needs (02/04/2023)   PRAPARE - Administrator, Civil Service (Medical): No    Lack of Transportation (Non-Medical): No  Physical Activity: Sufficiently Active (02/04/2023)   Exercise Vital Sign    Days of  Exercise per Week: 5 days    Minutes of Exercise per Session: 60 min  Stress: No Stress Concern Present (02/04/2023)   [employer] of Occupational Health - Occupational Stress Questionnaire    Feeling of Stress : Not at all  Social Connections: Moderately Integrated (02/04/2023)   Social Connection and Isolation Panel [NHANES]    Frequency of Communication with Friends and Family: More than three times a week    Frequency of Social Gatherings with Friends and Family: Three times a week    Attends Religious Services: More than 4 times per year    Active Member of Clubs or Organizations: No    Attends Banker Meetings: Never    Marital Status: Married    Tobacco Counseling Counseling given: Not Answered   Clinical  Intake:  Pre-visit preparation completed: Yes  Pain : No/denies pain     Diabetes: No  How often do you need to have someone help you when you read instructions, pamphlets, or other written materials from your doctor or pharmacy?: 1 - Never  Interpreter Needed?: No  Information entered by :: Charmaine Bloodgood LPN   Activities of Daily Living    02/04/2023    4:07 PM  In your present state of health, do you have any difficulty performing the following activities:  Hearing? 0  Vision? 0  Difficulty concentrating or making decisions? 0  Walking or climbing stairs? 0  Dressing or bathing? 0  Doing errands, shopping? 0  Preparing Food and eating ? N  Using the Toilet? N  In the past six months, have you accidently leaked urine? N  Do you have problems with loss of bowel control? N  Managing your Medications? N  Managing your Finances? N  Housekeeping or managing your Housekeeping? N    Patient Care Team: Deitra Morton Hummer, Nena, NP as PCP - General (Nurse Practitioner)  Indicate any recent Medical Services you may have received from other than [facility] providers in the past year (date may be approximate).     Assessment:   This is a routine wellness examination for Judith Stokes.  Hearing/Vision screen Hearing Screening - Comments:: Denies hearing difficulties   Vision Screening - Comments:: No vision problems; will schedule routine eye exam soon     Goals Addressed             This Visit's Progress    Remain active and independent        Depression Screen    02/04/2023    4:15 PM 12/03/2022   12:41 PM 04/22/2014    3:34 PM  PHQ 2/9 Scores  PHQ - 2 Score 6 6 6   PHQ- 9 Score 10 12 18     Fall Risk    02/04/2023    4:07 PM  Fall Risk   Falls in the past year? 0  Number falls in past yr: 0  Injury with Fall? 0  Risk for fall due to : No Fall Risks  Follow up Falls prevention discussed;Education provided;Falls evaluation completed    MEDICARE RISK AT  HOME: Medicare Risk at Home Any stairs in or around the home?: No If so, are there any without handrails?: No Home free of loose throw rugs in walkways, pet beds, electrical cords, etc?: Yes Adequate lighting in your home to reduce risk of falls?: Yes Life alert?: No Use of a cane, walker or w/c?: No Grab bars in the bathroom?: Yes Shower chair or bench in shower?: No Elevated toilet seat or  a handicapped toilet?: Yes  TIMED UP AND GO:  Was the test performed? Yes  Length of time to ambulate 10 feet: 8 sec Gait slow and steady without use of assistive device    Cognitive Function:        02/04/2023    4:08 PM  6CIT Screen  What Year? 0 points  What month? 0 points  What time? 0 points  Count back from 20 0 points  Months in reverse 0 points  Repeat phrase 0 points  Total Score 0 points    Immunizations Immunization History  Administered Date(s) Administered   Influenza,inj,quad, With Preservative 10/19/2015   PPD Test 02/28/2015   Tdap 11/27/2020   Zoster Recombinant(Shingrix) 04/25/2022, 07/31/2022    TDAP status: Up to date  Flu Vaccine status: Declined, Education has been provided regarding the importance of this vaccine but patient still declined. Advised may receive this vaccine at local pharmacy or Health Dept. Aware to provide a copy of the vaccination record if obtained from local pharmacy or Health Dept. Verbalized acceptance and understanding.  Pneumococcal vaccine status: Declined,  Education has been provided regarding the importance of this vaccine but patient still declined. Advised may receive this vaccine at local pharmacy or Health Dept. Aware to provide a copy of the vaccination record if obtained from local pharmacy or Health Dept. Verbalized acceptance and understanding.   Covid-19 vaccine status: Information provided on how to obtain vaccines.   Qualifies for Shingles Vaccine? Yes   Zostavax completed No   Shingrix Completed?: Yes  Screening  Tests Health Maintenance  Topic Date Due   MAMMOGRAM  08/19/2009   DEXA SCAN  Never done   COVID-19 Vaccine (1 - 2024-25 season) Never done   INFLUENZA VACCINE  04/21/2023 (Originally 08/22/2022)   Pneumonia Vaccine 63+ Years old (1 of 1 - PCV) 12/03/2023 (Originally 05/26/2021)   Hepatitis C Screening  12/03/2023 (Originally 05/27/1973)   Medicare Annual Wellness (AWV)  02/04/2024   Colonoscopy  04/10/2026   DTaP/Tdap/Td (2 - Td or Tdap) 11/28/2030   Zoster Vaccines- Shingrix  Completed   HPV VACCINES  Aged Out    Health Maintenance  Health Maintenance Due  Topic Date Due   MAMMOGRAM  08/19/2009   DEXA SCAN  Never done   COVID-19 Vaccine (1 - 2024-25 season) Never done    Colorectal cancer screening: Type of screening: Colonoscopy. Completed 3/20/8. Repeat every 10 years  Mammogram status: Ordered and scheduled for 02/12/23. Pt provided with contact info and advised to call to schedule appt.   Bone Density status: Ordered and scheduled for 04/16/23. Pt provided with contact info and advised to call to schedule appt.  Lung Cancer Screening: (Low Dose CT Chest recommended if Age 32-80 years, 20 pack-year currently smoking OR have quit w/in 15years.) does not qualify.   Lung Cancer Screening Referral: n/a  Additional Screening:  Hepatitis C Screening: does qualify  Vision Screening: Recommended annual ophthalmology exams for early detection of glaucoma and other disorders of the eye. Is the patient up to date with their annual eye exam?  Yes  Who is the provider or what is the name of the office in which the patient attends annual eye exams? [facility]  If pt is not established with a provider, would they like to be referred to a provider to establish care? No .   Dental Screening: Recommended annual dental exams for proper oral hygiene  Community Resource Referral / Chronic Care Management: CRR required this visit?  No   CCM required this visit?  No     Plan:     I  have personally reviewed and noted the following in the patient's chart:   Medical and social history Use of alcohol, tobacco or illicit drugs  Current medications and supplements including opioid prescriptions. Patient is not currently taking opioid prescriptions. Functional ability and status Nutritional status Physical activity Advanced directives List of other physicians Hospitalizations, surgeries, and ER visits in previous 12 months Vitals Screenings to include cognitive, depression, and falls Referrals and appointments  In addition, I have reviewed and discussed with patient certain preventive protocols, quality metrics, and best practice recommendations. A written personalized care plan for preventive services as well as general preventive health recommendations were provided to patient.     Lavelle Pfeiffer [place], [place]   [id number]/7974   After Visit Summary: (In Person-Printed) AVS printed and given to the patient  Nurse Notes: No concerns at this time

## 2023-02-04 NOTE — Patient Instructions (Signed)
 Ms. Rollinson , Thank you for taking time to come for your Medicare Wellness Visit. I appreciate your ongoing commitment to your health goals. Please review the following plan we discussed and let me know if I can assist you in the future.   Referrals/Orders/Follow-Ups/Clinician Recommendations: Aim for 30 minutes of exercise or brisk walking, 6-8 glasses of water, and 5 servings of fruits and vegetables each day.   This is a list of the screening recommended for you and due dates:  Health Maintenance  Topic Date Due   Medicare Annual Wellness Visit  Never done   Mammogram  08/19/2009   DEXA scan (bone density measurement)  Never done   COVID-19 Vaccine (1 - 2024-25 season) Never done   Flu Shot  04/21/2023*   Pneumonia Vaccine (1 of 1 - PCV) 12/03/2023*   Hepatitis C Screening  12/03/2023*   Colon Cancer Screening  04/10/2026   DTaP/Tdap/Td vaccine (2 - Td or Tdap) 11/28/2030   Zoster (Shingles) Vaccine  Completed   HPV Vaccine  Aged Out  *Topic was postponed. The date shown is not the original due date.    Advanced directives: (ACP Link)Information on Advanced Care Planning can be found at [place]  Secretary of [facility] Advance Health Care Directives Advance Health Care Directives ([email or web address]   Next Medicare Annual Wellness Visit scheduled for next year: Yes

## 2023-02-06 ENCOUNTER — Ambulatory Visit: Payer: Medicare HMO | Admitting: Nurse Practitioner

## 2023-02-06 ENCOUNTER — Other Ambulatory Visit: Payer: Medicare HMO

## 2023-02-10 ENCOUNTER — Other Ambulatory Visit: Payer: Self-pay | Admitting: Nurse Practitioner

## 2023-02-10 DIAGNOSIS — I10 Essential (primary) hypertension: Secondary | ICD-10-CM

## 2023-02-12 ENCOUNTER — Inpatient Hospital Stay: Admission: RE | Admit: 2023-02-12 | Payer: Self-pay | Source: Ambulatory Visit

## 2023-02-12 ENCOUNTER — Ambulatory Visit
Admission: RE | Admit: 2023-02-12 | Discharge: 2023-02-12 | Disposition: A | Discharge status: Home / Self Care | Payer: Medicare HMO | Source: Ambulatory Visit | Attending: Nurse Practitioner | Admitting: Nurse Practitioner

## 2023-02-12 DIAGNOSIS — Z1231 Encounter for screening mammogram for malignant neoplasm of breast: Secondary | ICD-10-CM

## 2023-02-19 ENCOUNTER — Ambulatory Visit: Payer: Medicare HMO | Admitting: Nurse Practitioner

## 2023-02-19 ENCOUNTER — Encounter: Payer: Self-pay | Admitting: Nurse Practitioner

## 2023-02-19 ENCOUNTER — Telehealth: Payer: Self-pay | Admitting: Nurse Practitioner

## 2023-02-19 VITALS — BP 175/77 | HR 71 | Temp 97.9°F | Ht 61.5 in | Wt 170.8 lb

## 2023-02-19 DIAGNOSIS — J029 Acute pharyngitis, unspecified: Secondary | ICD-10-CM

## 2023-02-19 DIAGNOSIS — F332 Major depressive disorder, recurrent severe without psychotic features: Secondary | ICD-10-CM | POA: Diagnosis not present

## 2023-02-19 DIAGNOSIS — I10 Essential (primary) hypertension: Secondary | ICD-10-CM

## 2023-02-19 DIAGNOSIS — R0981 Nasal congestion: Secondary | ICD-10-CM | POA: Diagnosis not present

## 2023-02-19 DIAGNOSIS — R051 Acute cough: Secondary | ICD-10-CM

## 2023-02-19 DIAGNOSIS — J321 Chronic frontal sinusitis: Secondary | ICD-10-CM

## 2023-02-19 LAB — VERITOR FLU A/B WAIVED
Influenza A: NEGATIVE
Influenza B: NEGATIVE

## 2023-02-19 LAB — RAPID STREP SCREEN (MED CTR MEBANE ONLY): Strep Gp A Ag, IA W/Reflex: NEGATIVE

## 2023-02-19 LAB — CULTURE, GROUP A STREP

## 2023-02-19 MED ORDER — BENZONATATE 100 MG PO CAPS: 100.0000 mg | ORAL_CAPSULE | Freq: Three times a day (TID) | ORAL | 0 refills | Status: DC | PRN

## 2023-02-19 MED ORDER — AMLODIPINE BESYLATE 5 MG PO TABS: 5.0000 mg | ORAL_TABLET | Freq: Every day | ORAL | 0 refills | Status: DC

## 2023-02-19 MED ORDER — AZITHROMYCIN 250 MG PO TABS: ORAL_TABLET | ORAL | 0 refills | Status: DC

## 2023-02-19 NOTE — Addendum Note (Signed)
Addended by: Daisy Blossom on: 02/19/2023 04:02 PM   Modules accepted: Orders

## 2023-02-19 NOTE — Telephone Encounter (Signed)
Patient seen in office today for cough. Requesting script for cough medication.  Copied from CRM [id number]. Topic: Clinical - Medication Question >> Feb 19, 2023  1:21 PM Clayton Bibles wrote: Reason for CRM: Meghan forgot to ask for the pearl cough medication. Please send to [facility] in [place]. Please call her back if any questions.

## 2023-02-19 NOTE — Progress Notes (Signed)
Acute Office Visit  Subjective:     Patient ID: Judith Stokes, female    DOB: Jun 20, 1955, 68 y.o.   MRN: [id number]  Chief Complaint  Patient presents with   Cough    Symptoms started monday   Sore Throat   Nasal Congestion    HPI Judith Stokes 68 year old female present today 02/19/2023 for an acute visit concern for URI symptoms and anxiety medication not working. Sinusitis: Patient presents with chronic sinusitis. The patient reports chronic sinus infections for 3 days.  Her symptoms include nasal congestion, purulent rhinorrhea, cough.  There has not been a history of fevers, mouthbreathing, headaches, facial pain, periorbital venous congestion. There does not have been a history of chronic otitis media or pharyngotonsillitis.  Prior antibiotic therapy has included no recent courses. Other medications have included none.   POC strep, flu negative  HTN Not well-controlled: Current medication lisinopril 20 mg daily  BP Readings from Last 3 Encounters:  02/19/23 (!) 175/77  02/04/23 (!) 99/48  01/16/23 130/80   Wt Readings from Last 3 Encounters:  02/19/23 170 lb 12.8 oz (77.5 kg)  02/04/23 168 lb (76.2 kg)  01/16/23 172 lb 12.8 oz (78.4 kg)     She was last seen for hypertension   02/04/2023  for nurse visit fro BP check  BP at that visit was 99/48. Management since that visit includes Lisinopril 20 mg dialy. She reports excellent compliance with treatment. She is not having side effects.  She is following a Regular diet. She is not exercising. She does not smoke. Use of agents associated with hypertension: none.  Outside blood pressures are 140/90. Symptoms: No chest pain No chest pressure  No palpitations No syncope  No dyspnea No orthopnea  No paroxysmal nocturnal dyspnea No lower extremity edema   Pertinent labs Lab Results  Component Value Date   CHOL 207 (H) 12/03/2022   HDL 116 12/03/2022   LDLCALC 68 12/03/2022   TRIG 140 12/03/2022   CHOLHDL 1.8  12/03/2022   Lab Results  Component Value Date   NA 144 12/03/2022   K 4.3 12/03/2022   CREATININE 0.58 12/03/2022   EGFR 100 12/03/2022   GLUCOSE 84 12/03/2022   TSH 2.710 12/03/2022     The ASCVD Risk score (Arnett DK, et al., 2019) failed to calculate for the following reasons:   The valid HDL cholesterol range is 20 to 100 mg/dL  MDD: She has diagnosis of depression and was started on Lexapro came back for visit reported medication was not working we added risperidone 0.25 mg at bedtime however client reports since her last visit she stopped taking Lexapro he only been taking buspirone and reports that she does  feel like Lexapro is helping her symptoms "was not sure that I was supposed to take both medication".  Client is instructed to take both medication as prescribed.  She denies suicidal Seidel ideation, reports  getting irritated easily Active Ambulatory Problems    Diagnosis Date Noted   Depression 04/22/2014   Insomnia 04/22/2014   Primary hypertension 12/03/2022   Postmenopausal estrogen deficiency 12/03/2022   Routine medical exam 12/03/2022   Encounter for general adult medical examination with abnormal findings 12/03/2022   Anxiety associated with depression 12/03/2022   Polydipsia 12/03/2022   Family history of diabetes mellitus in father 12/03/2022   Family history of early CAD 12/03/2022   Class 1 obesity due to excess calories without serious comorbidity with body mass index (BMI) of 31.0 to  31.9 in adult 12/03/2022   Pain in both lower extremities 01/16/2023   Severe episode of recurrent major depressive disorder, without psychotic features (HCC) 02/19/2023   Frontal sinusitis 02/19/2023   Sore throat 02/19/2023   Nasal congestion 02/19/2023   Acute cough 02/19/2023   Resolved Ambulatory Problems    Diagnosis Date Noted   No Resolved Ambulatory Problems   Past Medical History:  Diagnosis Date   History of blood clots      Review of Systems   Constitutional:  Negative for chills and fever.  HENT:  Positive for congestion and sinus pain. Negative for sore throat.   Respiratory:  Positive for cough.        Has been OTC  cough Supressant with minimal relieve  Cardiovascular:  Negative for chest pain, palpitations and leg swelling.  Gastrointestinal:  Negative for constipation, diarrhea, nausea and vomiting.  Musculoskeletal:  Negative for falls and myalgias.  Skin:  Negative for itching and rash.  Neurological:  Negative for dizziness and headaches.  Psychiatric/Behavioral:  Positive for depression. Negative for hallucinations and suicidal ideas. The patient does not have insomnia.    Negative unless indicated in HPI    Objective:    BP (!) 175/77   Pulse 71   Temp 97.9 F (36.6 C) (Temporal)   Ht 5' 1.5" (1.562 m)   Wt 170 lb 12.8 oz (77.5 kg)   SpO2 97%   BMI 31.75 kg/m  BP Readings from Last 3 Encounters:  02/19/23 (!) 175/77  02/04/23 (!) 99/48  01/16/23 130/80   Wt Readings from Last 3 Encounters:  02/19/23 170 lb 12.8 oz (77.5 kg)  02/04/23 168 lb (76.2 kg)  01/16/23 172 lb 12.8 oz (78.4 kg)      Physical Exam Vitals and nursing note reviewed.  Constitutional:      Appearance: She is obese.  HENT:     Head: Normocephalic and atraumatic.     Right Ear: Ear canal and external ear normal. There is no impacted cerumen.     Left Ear: Tympanic membrane, ear canal and external ear normal. There is no impacted cerumen.     Nose: Nasal tenderness and congestion present.     Right Sinus: Frontal sinus tenderness present.     Left Sinus: Frontal sinus tenderness present.     Mouth/Throat:     Mouth: Mucous membranes are moist.  Cardiovascular:     Rate and Rhythm: Normal rate and regular rhythm.  Pulmonary:     Effort: Pulmonary effort is normal.     Breath sounds: Normal breath sounds.  Musculoskeletal:        General: Normal range of motion.     Cervical back: Normal range of motion and neck supple. No  rigidity or tenderness.     Right lower leg: No edema.     Left lower leg: No edema.  Lymphadenopathy:     Cervical: No cervical adenopathy.  Skin:    General: Skin is warm and dry.     Findings: No rash.  Neurological:     Mental Status: She is alert and oriented to person, place, and time.  Psychiatric:        Attention and Perception: Attention and perception normal.        Mood and Affect: Mood is depressed.        Speech: Speech normal.        Behavior: Behavior normal. Behavior is cooperative.        Thought Content:  Thought content normal. Thought content does not include homicidal or suicidal ideation. Thought content does not include homicidal or suicidal plan.        Cognition and Memory: Cognition and memory normal.        Judgment: Judgment normal.     No results found for any visits on 02/19/23.      Assessment & Plan:  Acute cough -     Novel Coronavirus, NAA ([facility]) -     Veritor Flu A/B Waived -     Rapid Strep Screen ([facility] ONLY) -     Azithromycin; Take 2 tabs on the first day and 1-tab until done  Dispense: 6 each; Refill: 0  Frontal sinusitis, unspecified chronicity  Sore throat -     Novel Coronavirus, NAA ([facility]) -     Veritor Flu A/B Waived -     Rapid Strep Screen ([facility] ONLY) -     Azithromycin; Take 2 tabs on the first day and 1-tab until done  Dispense: 6 each; Refill: 0  Nasal congestion -     Novel Coronavirus, NAA ([facility]) -     Veritor Flu A/B Waived -     Rapid Strep Screen ([facility] ONLY) -     Azithromycin; Take 2 tabs on the first day and 1-tab until done  Dispense: 6 each; Refill: 0  Primary hypertension -     amLODIPine Besylate; Take 1 tablet (5 mg total) by mouth daily.  Dispense: 90 tablet; Refill: 0  Severe episode of recurrent major depressive disorder, without psychotic features (HCC)  Aspynn 68 year old Caucasian female seen today for URI and chronic disease management, no acute  distress  Sinusitis : Will prescribe Z-Pak No. 6 dispense client is instructed to take 2 tablet in the morning take 1 tablet daily until done; Continue OTC Coricidin  MDD: Client to take both Lexapro 10 mg and Risperdal 0.5 mg at bedtime and will follow-up as already scheduled  HTN: Not well-controlled with lisinopril 20 mg plan to add amlodipine 5 mg daily, DASH diet  Return today (on 02/19/2023) for as already prescribed.  Arrie Aran Santa Lighter, [facility] [address] [place], [place] [address] [phone]  Note: This document was prepared by [employer] voice dictation technology and any errors that results from this process are unintentional.

## 2023-02-19 NOTE — Telephone Encounter (Signed)
Rx sent to pharmacy. Verbal okay from sandra

## 2023-02-20 LAB — NOVEL CORONAVIRUS, NAA: SARS-CoV-2, NAA: NOT DETECTED

## 2023-02-28 ENCOUNTER — Other Ambulatory Visit: Payer: Self-pay | Admitting: Nurse Practitioner

## 2023-02-28 DIAGNOSIS — F332 Major depressive disorder, recurrent severe without psychotic features: Secondary | ICD-10-CM

## 2023-02-28 DIAGNOSIS — F418 Other specified anxiety disorders: Secondary | ICD-10-CM

## 2023-03-02 ENCOUNTER — Other Ambulatory Visit: Payer: Self-pay | Admitting: Nurse Practitioner

## 2023-04-13 ENCOUNTER — Other Ambulatory Visit: Payer: Self-pay | Admitting: Nurse Practitioner

## 2023-04-13 DIAGNOSIS — I10 Essential (primary) hypertension: Secondary | ICD-10-CM

## 2023-04-13 DIAGNOSIS — F418 Other specified anxiety disorders: Secondary | ICD-10-CM

## 2023-04-16 ENCOUNTER — Ambulatory Visit: Payer: Medicare HMO | Admitting: Nurse Practitioner

## 2023-04-16 ENCOUNTER — Encounter: Payer: Self-pay | Admitting: Nurse Practitioner

## 2023-04-16 ENCOUNTER — Ambulatory Visit (INDEPENDENT_AMBULATORY_CARE_PROVIDER_SITE_OTHER): Payer: Medicare HMO

## 2023-04-16 VITALS — BP 163/78 | HR 66 | Temp 97.9°F | Ht 61.5 in | Wt 166.2 lb

## 2023-04-16 DIAGNOSIS — F99 Mental disorder, not otherwise specified: Secondary | ICD-10-CM | POA: Diagnosis not present

## 2023-04-16 DIAGNOSIS — Z780 Asymptomatic menopausal state: Secondary | ICD-10-CM | POA: Diagnosis not present

## 2023-04-16 DIAGNOSIS — F5105 Insomnia due to other mental disorder: Secondary | ICD-10-CM | POA: Diagnosis not present

## 2023-04-16 DIAGNOSIS — F418 Other specified anxiety disorders: Secondary | ICD-10-CM

## 2023-04-16 DIAGNOSIS — I10 Essential (primary) hypertension: Secondary | ICD-10-CM

## 2023-04-16 DIAGNOSIS — F330 Major depressive disorder, recurrent, mild: Secondary | ICD-10-CM

## 2023-04-16 MED ORDER — LISINOPRIL-HYDROCHLOROTHIAZIDE 20-12.5 MG PO TABS: 1.0000 | ORAL_TABLET | Freq: Every day | ORAL | 0 refills | Status: DC

## 2023-04-16 MED ORDER — TRAZODONE HCL 50 MG PO TABS: ORAL_TABLET | ORAL | 0 refills | Status: DC

## 2023-04-16 MED ORDER — ESCITALOPRAM OXALATE 20 MG PO TABS: 20.0000 mg | ORAL_TABLET | Freq: Every day | ORAL | 0 refills | Status: DC

## 2023-04-16 NOTE — Progress Notes (Unsigned)
 Established Patient Office Visit  Subjective  Patient ID: Judith Stokes, female    DOB: 01/31/1955  Age: 68 y.o. MRN: [id number]  Chief Complaint  Patient presents with   Medical Management of Chronic Issues    3 month    HPI Judith Stokes is 68 yrs old female seen today fro 35-months follow up for chronic disease management. PMH of anemia, Depression, HTN, new concerns with Insomnia  Insomnia New Problem 68 year old female presenting with complaints of insomnia. She reports that she is only able to get 5-6 hours of sleep per night. She describes difficulty falling asleep and staying asleep, without any specific triggers or changes in routine. The patient denies symptoms of daytime fatigue, mood changes, or any significant lifestyle changes that could be contributing to the sleep disturbance. She is seeking evaluation and possible treatment options to improve her sleep quality.  Hypertension, follow-up  BP Readings from Last 3 Encounters:  04/16/23 (!) 163/78  02/19/23 (!) 175/77  02/04/23 (!) 99/48   Wt Readings from Last 3 Encounters:  04/16/23 166 lb 3.2 oz (75.4 kg)  02/19/23 170 lb 12.8 oz (77.5 kg)  02/04/23 168 lb (76.2 kg)     Not well controlled with current medications She was last seen for hypertension 3 months ago.  BP at that visit was 175/77. Management since that visit includes Amlodopine and Lisinopril 20 mg daily. She reports excellent compliance with treatment. She is not having side effects.  She is following a Regular diet. She is exercising. She does smoke. Use of agents associated with hypertension: none.  Outside blood pressures are 180/90. Symptoms: No chest pain No chest pressure  No palpitations No syncope  No dyspnea No orthopnea  No paroxysmal nocturnal dyspnea No lower extremity edema   Pertinent labs Lab Results  Component Value Date   CHOL 207 (H) 12/03/2022   HDL 116 12/03/2022   LDLCALC 68 12/03/2022   TRIG 140 12/03/2022    CHOLHDL 1.8 12/03/2022   Lab Results  Component Value Date   NA 143 04/16/2023   K 4.4 04/16/2023   CREATININE 0.51 (L) 04/16/2023   EGFR 103 04/16/2023   GLUCOSE 88 04/16/2023   TSH 2.710 12/03/2022     The ASCVD Risk score (Arnett DK, et al., 2019) failed to calculate for the following reasons:   The valid HDL cholesterol range is 20 to 100 mg/dL  Depression, Follow-up  She  was last seen for this 3 months ago. Changes made at last visit include Lexapro 10 mg  and risperidone 0,25 mg at bedtime.  She stopped taking "Lexapro and Riperdonal " in the past Xanax use to help me" She reports poor compliance with treatment. She is not having side effects.  She reports  tolerance of treatment. Current symptoms include: depressed mood, fatigue, hopelessness, and insomnia She feels she is Unchanged since last visit. " Government messed me up at 68 years old I had to go back to work because they will not let me draw my has been VA benefits and Social Security as I have been doing in the past.  They took away $1500 for me and now I have to file a way to pay for my bills".  She declined counseling  "last time I went to see psychologist after talking to me he said that he needed to go see a psychiatrist for his mental health, so no thank you"     04/16/2023    3:12 PM 02/04/2023  4:15 PM 12/03/2022   12:41 PM  Depression screen PHQ 2/9  Decreased Interest 2 3 3   Down, Depressed, Hopeless 0 3 3  PHQ - 2 Score 2 6 6   Altered sleeping 2 2 3   Tired, decreased energy 1 2 2   Change in appetite  0 0  Feeling bad or failure about yourself   0   Trouble concentrating 1 0 0  Moving slowly or fidgety/restless 1 0 1  Suicidal thoughts 0 0 0  PHQ-9 Score 7 10 12   Difficult doing work/chores Very difficult  Not difficult at all       04/16/2023    3:16 PM 12/03/2022   12:42 PM  GAD 7 : Generalized Anxiety Score  Nervous, Anxious, on Edge 1 3  Control/stop worrying 1 3  Worry too much -  different things 1 3  Trouble relaxing 0 3  Restless 0 3  Easily annoyed or irritable 1 2  Afraid - awful might happen 1 0  Total GAD 7 Score 5 17  Anxiety Difficulty Somewhat difficult Not difficult at all   Anxiety, Follow-up She was last seen for anxiety 3 months ago. Changes made at last visit include continue Lexapro.  She reports poor compliance with treatment. Stop taking  She reports excellent tolerance of treatment. She is not having side effects. ' feel like it was not working, so I stop taking it, Xanax use to help my anxiety" She feels her anxiety is moderate and Unchanged since last visit.  Symptoms: No chest pain No difficulty concentrating  No dizziness No fatigue  No feelings of losing control No insomnia  Yes irritable No palpitations  Yes panic attacks Yes racing thoughts  No shortness of breath Yes sweating  Yes tremors/shakes    GAD-7 Results    04/16/2023    3:16 PM 12/03/2022   12:42 PM  GAD-7 Generalized Anxiety Disorder Screening Tool  1. Feeling Nervous, Anxious, or on Edge 1 3  2. Not Being Able to Stop or Control Worrying 1 3  3. Worrying Too Much About Different Things 1 3  4. Trouble Relaxing 0 3  5. Being So Restless it's Hard To Sit Still 0 3  6. Becoming Easily Annoyed or Irritable 1 2  7. Feeling Afraid As If Something Awful Might Happen 1 0  Total GAD-7 Score 5 17  Difficulty At Work, Home, or Getting  Along With Others? Somewhat difficult Not difficult at all    Anemia 68 year old female with a history of anemia, previously prescribed iron supplements. She presents with complaints of fatigue and tiredness, which she attributes to her anemia. The patient reports discontinuing the iron supplementation due to constipation and expresses a preference to avoid using stool softeners. She is not open diet modifications   Patient Active Problem List   Diagnosis Date Noted   Severe episode of recurrent major depressive disorder, without psychotic  features (HCC) 02/19/2023   Frontal sinusitis 02/19/2023   Sore throat 02/19/2023   Nasal congestion 02/19/2023   Acute cough 02/19/2023   Pain in both lower extremities 01/16/2023   Primary hypertension 12/03/2022   Postmenopausal estrogen deficiency 12/03/2022   Routine medical exam 12/03/2022   Encounter for general adult medical examination with abnormal findings 12/03/2022   Anxiety associated with depression 12/03/2022   Polydipsia 12/03/2022   Family history of diabetes mellitus in father 12/03/2022   Family history of early CAD 12/03/2022   Class 1 obesity due to excess calories without serious comorbidity  with body mass index (BMI) of 31.0 to 31.9 in adult 12/03/2022   Depression 04/22/2014   Insomnia 04/22/2014   Past Medical History:  Diagnosis Date   History of blood clots    Past Surgical History:  Procedure Laterality Date   ABDOMINAL HYSTERECTOMY     GASTRIC BYPASS     HAND TENDON SURGERY Left    TONSILLECTOMY     TUBAL LIGATION     Social History   Tobacco Use   Smoking status: Never  Substance Use Topics   Alcohol use: No    Alcohol/week: 0.0 standard drinks of alcohol   Drug use: No   Social History   Socioeconomic History   Marital status: Married    Spouse name: Not on file   Number of children: Not on file   Years of education: Not on file   Highest education level: Not on file  Occupational History   Not on file  Tobacco Use   Smoking status: Never   Smokeless tobacco: Not on file  Substance and Sexual Activity   Alcohol use: No    Alcohol/week: 0.0 standard drinks of alcohol   Drug use: No   Sexual activity: Not on file  Other Topics Concern   Not on file  Social History Narrative   Not on file   Social Drivers of Health   Financial Resource Strain: Low Risk  (02/04/2023)   Overall Financial Resource Strain (CARDIA)    Difficulty of Paying Living Expenses: Not hard at all  Food Insecurity: No Food Insecurity (02/04/2023)    Hunger Vital Sign    Worried About Running Out of Food in the Last Year: Never true    Ran Out of Food in the Last Year: Never true  Transportation Needs: No Transportation Needs (02/04/2023)   PRAPARE - Administrator, Civil Service (Medical): No    Lack of Transportation (Non-Medical): No  Physical Activity: Sufficiently Active (02/04/2023)   Exercise Vital Sign    Days of Exercise per Week: 5 days    Minutes of Exercise per Session: 60 min  Stress: No Stress Concern Present (02/04/2023)   [employer] of Occupational Health - Occupational Stress Questionnaire    Feeling of Stress : Not at all  Social Connections: Moderately Integrated (02/04/2023)   Social Connection and Isolation Panel [NHANES]    Frequency of Communication with Friends and Family: More than three times a week    Frequency of Social Gatherings with Friends and Family: Three times a week    Attends Religious Services: More than 4 times per year    Active Member of Clubs or Organizations: No    Attends Banker Meetings: Never    Marital Status: Married  Catering manager Violence: Not At Risk (02/04/2023)   Humiliation, Afraid, Rape, and Kick questionnaire    Fear of Current or Ex-Partner: No    Emotionally Abused: No    Physically Abused: No    Sexually Abused: No   Family Status  Relation Name Status   Mother  Deceased   Father  Deceased   Sister  Alive   Brother  Alive   Neg Hx  (Not Specified)  No partnership data on file   Family History  Problem Relation Age of Onset   Cancer Mother    COPD Father    Cancer Sister    Breast cancer Neg Hx    Allergies  Allergen Reactions   Oxycontin [Oxycodone Hcl] Hives  Penicillins Hives      Review of Systems  Constitutional:  Negative for chills and fever.  Respiratory:  Negative for cough and wheezing.   Cardiovascular:  Negative for chest pain and leg swelling.  Gastrointestinal:  Negative for nausea and vomiting.   Neurological:  Negative for dizziness and headaches.  Psychiatric/Behavioral:  Positive for depression. The patient is nervous/anxious and has insomnia.    Negative unless indicated in HPI   Objective:     BP (!) 163/78   Pulse 66   Temp 97.9 F (36.6 C) (Temporal)   Ht 5' 1.5" (1.562 m)   Wt 166 lb 3.2 oz (75.4 kg)   SpO2 99%   BMI 30.89 kg/m  BP Readings from Last 3 Encounters:  04/16/23 (!) 163/78  02/19/23 (!) 175/77  02/04/23 (!) 99/48   Wt Readings from Last 3 Encounters:  04/16/23 166 lb 3.2 oz (75.4 kg)  02/19/23 170 lb 12.8 oz (77.5 kg)  02/04/23 168 lb (76.2 kg)      Physical Exam Vitals and nursing note reviewed.  Constitutional:      Appearance: She is obese.  HENT:     Head: Normocephalic and atraumatic.     Nose: Nose normal.     Mouth/Throat:     Mouth: Mucous membranes are moist.  Eyes:     General: No scleral icterus.    Extraocular Movements: Extraocular movements intact.     Conjunctiva/sclera: Conjunctivae normal.     Pupils: Pupils are equal, round, and reactive to light.  Cardiovascular:     Heart sounds: Normal heart sounds.  Pulmonary:     Effort: Pulmonary effort is normal.     Breath sounds: Normal breath sounds.  Musculoskeletal:        General: Normal range of motion.     Right lower leg: No edema.     Left lower leg: No edema.  Skin:    General: Skin is warm and dry.     Capillary Refill: Capillary refill takes less than 2 seconds.     Findings: No rash.  Neurological:     Mental Status: She is alert and oriented to person, place, and time.  Psychiatric:        Attention and Perception: Attention and perception normal.        Mood and Affect: Mood is anxious and depressed.        Speech: Speech normal.        Behavior: Behavior normal. Behavior is cooperative.        Thought Content: Thought content normal. Thought content does not include suicidal ideation. Thought content does not include suicidal plan.        Cognition  and Memory: Cognition and memory normal.        Judgment: Judgment normal.    Results for orders placed or performed in visit on 04/16/23  Vitamin B12  Result Value Ref Range   Vitamin B-12 266 232 - 1,245 pg/mL  BMP8+EGFR  Result Value Ref Range   Glucose 88 70 - 99 mg/dL   BUN 19 8 - 27 mg/dL   Creatinine, Ser 6.96 (L) 0.57 - 1.00 mg/dL   eGFR 295 >28 UX/LKG/4.01   BUN/Creatinine Ratio 37 (H) 12 - 28   Sodium 143 134 - 144 mmol/L   Potassium 4.4 3.5 - 5.2 mmol/L   Chloride 104 96 - 106 mmol/L   CO2 26 20 - 29 mmol/L   Calcium 9.3 8.7 - 10.3 mg/dL  Last CBC Lab Results  Component Value Date   WBC 3.5 12/03/2022   HGB 10.8 (L) 12/03/2022   HCT 36.2 12/03/2022   MCV 85 12/03/2022   MCH 25.3 (L) 12/03/2022   RDW 15.6 (H) 12/03/2022   PLT 326 12/03/2022   Last metabolic panel Lab Results  Component Value Date   GLUCOSE 88 04/16/2023   NA 143 04/16/2023   K 4.4 04/16/2023   CL 104 04/16/2023   CO2 26 04/16/2023   BUN 19 04/16/2023   CREATININE 0.51 (L) 04/16/2023   EGFR 103 04/16/2023   CALCIUM 9.3 04/16/2023   PROT 6.3 12/03/2022   ALBUMIN 4.1 12/03/2022   LABGLOB 2.2 12/03/2022   BILITOT 0.3 12/03/2022   ALKPHOS 104 12/03/2022   AST 14 12/03/2022   ALT 9 12/03/2022   Last lipids Lab Results  Component Value Date   CHOL 207 (H) 12/03/2022   HDL 116 12/03/2022   LDLCALC 68 12/03/2022   TRIG 140 12/03/2022   CHOLHDL 1.8 12/03/2022   Last hemoglobin A1c Lab Results  Component Value Date   HGBA1C 5.1 12/03/2022   Last thyroid functions Lab Results  Component Value Date   TSH 2.710 12/03/2022   T4TOTAL 4.9 12/03/2022     Assessment & Plan:  Primary hypertension -     Vitamin B12 -     BMP8+EGFR; Future -     Lisinopril-hydroCHLOROthiazide; Take 1 tablet by mouth daily.  Dispense: 90 tablet; Refill: 0  Mild episode of recurrent major depressive disorder (HCC) -     Escitalopram Oxalate; Take 1 tablet (20 mg total) by mouth daily.  Dispense: 90  tablet; Refill: 0  Insomnia due to other mental disorder -     traZODone HCl; 1-2 tabs PRN for sleep  Dispense: 90 tablet; Refill: 0  Anxiety associated with depression  Anxiety /depression: restart Lexapro at 20 mg daily  HTN: not well controlled d/c lisinopril, start Zestoretic 20-12.5 mg daily, continue Amlodipine 5 mg daily; DASH diet, BMP result pending  Insomnia: Trazodone 50 mg 1-2 tabs PRN  Anemia: advise client to continue ferrous sulfate; order b-12 level  Return in about 3 months (around 07/17/2023).  Encourage healthy lifestyle choices, including diet (rich in fruits, vegetables, and lean proteins, and low in salt and simple carbohydrates) and exercise (at least 30 minutes of moderate physical activity daily).     The above assessment and management plan was discussed with the patient. The patient verbalized understanding of and has agreed to the management plan. Patient is aware to call the clinic if they develop any new symptoms or if symptoms persist or worsen. Patient is aware when to return to the clinic for a follow-up visit. Patient educated on when it is appropriate to go to the emergency department.   Arrie Aran Santa Lighter, [facility] [address] [place], [place] [address] [phone]    Note: This document was prepared by [employer]ubin [employer] voice dictation technology and any errors that results from this process are unintentional.

## 2023-04-17 LAB — BMP8+EGFR
BUN/Creatinine Ratio: 37 — ABNORMAL HIGH (ref 12–28)
BUN: 19 mg/dL (ref 8–27)
CO2: 26 mmol/L (ref 20–29)
Calcium: 9.3 mg/dL (ref 8.7–10.3)
Chloride: 104 mmol/L (ref 96–106)
Creatinine, Ser: 0.51 mg/dL — ABNORMAL LOW (ref 0.57–1.00)
Glucose: 88 mg/dL (ref 70–99)
Potassium: 4.4 mmol/L (ref 3.5–5.2)
Sodium: 143 mmol/L (ref 134–144)
eGFR: 103 mL/min/{1.73_m2} (ref >59)

## 2023-04-17 LAB — VITAMIN B12: Vitamin B-12: 266 pg/mL (ref 232–1245)

## 2023-04-30 ENCOUNTER — Other Ambulatory Visit

## 2023-04-30 ENCOUNTER — Other Ambulatory Visit: Payer: Self-pay | Admitting: Nurse Practitioner

## 2023-04-30 DIAGNOSIS — I10 Essential (primary) hypertension: Secondary | ICD-10-CM

## 2023-05-01 ENCOUNTER — Other Ambulatory Visit: Payer: Self-pay | Admitting: Nurse Practitioner

## 2023-05-01 DIAGNOSIS — R944 Abnormal results of kidney function studies: Secondary | ICD-10-CM | POA: Insufficient documentation

## 2023-05-01 DIAGNOSIS — I10 Essential (primary) hypertension: Secondary | ICD-10-CM

## 2023-05-01 LAB — BMP8+EGFR
BUN/Creatinine Ratio: 32 — ABNORMAL HIGH (ref 12–28)
BUN: 35 mg/dL — ABNORMAL HIGH (ref 8–27)
CO2: 28 mmol/L (ref 20–29)
Calcium: 9.5 mg/dL (ref 8.7–10.3)
Chloride: 94 mmol/L — ABNORMAL LOW (ref 96–106)
Creatinine, Ser: 1.08 mg/dL — ABNORMAL HIGH (ref 0.57–1.00)
Glucose: 93 mg/dL (ref 70–99)
Potassium: 4.6 mmol/L (ref 3.5–5.2)
Sodium: 136 mmol/L (ref 134–144)
eGFR: 57 mL/min/{1.73_m2} — ABNORMAL LOW (ref >59)

## 2023-05-01 NOTE — Progress Notes (Signed)
 Called client to discuss lab results, will repeat labs in 4 weeks

## 2023-05-17 ENCOUNTER — Other Ambulatory Visit: Payer: Self-pay | Admitting: Nurse Practitioner

## 2023-05-17 DIAGNOSIS — I10 Essential (primary) hypertension: Secondary | ICD-10-CM

## 2023-05-22 ENCOUNTER — Other Ambulatory Visit

## 2023-05-22 ENCOUNTER — Other Ambulatory Visit: Payer: Self-pay | Admitting: Nurse Practitioner

## 2023-05-22 DIAGNOSIS — M79605 Pain in left leg: Secondary | ICD-10-CM

## 2023-05-22 DIAGNOSIS — I10 Essential (primary) hypertension: Secondary | ICD-10-CM

## 2023-05-22 DIAGNOSIS — R944 Abnormal results of kidney function studies: Secondary | ICD-10-CM

## 2023-05-23 ENCOUNTER — Other Ambulatory Visit: Payer: Self-pay | Admitting: Nurse Practitioner

## 2023-05-23 DIAGNOSIS — F5105 Insomnia due to other mental disorder: Secondary | ICD-10-CM

## 2023-05-23 LAB — CMP14+EGFR
ALT: 11 IU/L (ref 0–32)
AST: 18 IU/L (ref 0–40)
Albumin: 4.6 g/dL (ref 3.9–4.9)
Alkaline Phosphatase: 102 IU/L (ref 44–121)
BUN/Creatinine Ratio: 32 — ABNORMAL HIGH (ref 12–28)
BUN: 24 mg/dL (ref 8–27)
Bilirubin Total: 0.8 mg/dL (ref 0.0–1.2)
CO2: 25 mmol/L (ref 20–29)
Calcium: 10.1 mg/dL (ref 8.7–10.3)
Chloride: 95 mmol/L — ABNORMAL LOW (ref 96–106)
Creatinine, Ser: 0.76 mg/dL (ref 0.57–1.00)
Globulin, Total: 2.6 g/dL (ref 1.5–4.5)
Glucose: 106 mg/dL — ABNORMAL HIGH (ref 70–99)
Potassium: 4.3 mmol/L (ref 3.5–5.2)
Sodium: 136 mmol/L (ref 134–144)
Total Protein: 7.2 g/dL (ref 6.0–8.5)
eGFR: 86 mL/min/{1.73_m2} (ref >59)

## 2023-06-02 ENCOUNTER — Telehealth: Payer: Self-pay | Admitting: Nurse Practitioner

## 2023-06-02 NOTE — Telephone Encounter (Signed)
 Copied from CRM [id number]. Topic: Clinical - Medication Question >> Jun 02, 2023  3:01 PM Crispin Dolphin wrote: Reason for CRM: Patient called. States pharmacy requested medication for her legs and it was declined. 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 Not sure why it was declined. Per notes refill not appropriate. Thank You

## 2023-06-03 ENCOUNTER — Telehealth: Payer: Self-pay

## 2023-06-03 ENCOUNTER — Other Ambulatory Visit: Payer: Self-pay

## 2023-06-03 DIAGNOSIS — M79604 Pain in right leg: Secondary | ICD-10-CM

## 2023-06-03 MED ORDER — NAPROXEN 500 MG PO TABS: 500.0000 mg | ORAL_TABLET | Freq: Two times a day (BID) | ORAL | 1 refills | Status: DC | PRN

## 2023-06-03 NOTE — Telephone Encounter (Unsigned)
 Copied from CRM [id number]. Topic: Clinical - Prescription Issue >> Jun 03, 2023  9:06 AM Baldomero Bone wrote: Reason for CRM: Patient returning call from [facility] about refill for Naproxen . She wants to refill to go to [facility] at [address] [place]. Callback number is [phone]

## 2023-06-03 NOTE — Telephone Encounter (Signed)
 Left detailed message per dpr informing pt refill was sent in for her

## 2023-07-04 ENCOUNTER — Other Ambulatory Visit: Payer: Self-pay | Admitting: Nurse Practitioner

## 2023-07-04 DIAGNOSIS — I10 Essential (primary) hypertension: Secondary | ICD-10-CM

## 2023-07-04 DIAGNOSIS — F330 Major depressive disorder, recurrent, mild: Secondary | ICD-10-CM

## 2023-07-23 ENCOUNTER — Ambulatory Visit: Admitting: Nurse Practitioner

## 2023-07-28 ENCOUNTER — Other Ambulatory Visit: Payer: Self-pay | Admitting: Nurse Practitioner

## 2023-07-28 DIAGNOSIS — M79605 Pain in left leg: Secondary | ICD-10-CM

## 2023-07-30 NOTE — Progress Notes (Unsigned)
 Established Patient Office Visit  Subjective  Patient ID: Judith Stokes, female    DOB: 1955/03/05  Age: 68 y.o. MRN: [id number]  No chief complaint on file.   HPI Judith Stokes is a 68 yrs old female presents 07/31/2023 for 62-month follow up for chronic diseases management Hypertension, follow-up  BP Readings from Last 3 Encounters:  07/31/23 105/67  04/16/23 (!) 163/78  02/19/23 (!) 175/77   Wt Readings from Last 3 Encounters:  07/31/23 166 lb (75.3 kg)  04/16/23 166 lb 3.2 oz (75.4 kg)  02/19/23 170 lb 12.8 oz (77.5 kg)     She was last seen for hypertension 3 months ago.  BP at that visit was 163/78. Management since that visit includes zestoretic  20-12.5 mg daily.  She reports excellent compliance with treatment. She is not having side effects.  She is following a Regular diet. She is not exercising. She does smoke.  Use of agents associated with hypertension: none.   Outside blood pressures are not being monitored. Symptoms: No chest pain No chest pressure  No palpitations No syncope  No dyspnea No orthopnea  No paroxysmal nocturnal dyspnea No lower extremity edema   Pertinent labs Lab Results  Component Value Date   CHOL 207 (H) 12/03/2022   HDL 116 12/03/2022   LDLCALC 68 12/03/2022   TRIG 140 12/03/2022   CHOLHDL 1.8 12/03/2022   Lab Results  Component Value Date   NA 136 05/22/2023   K 4.3 05/22/2023   CREATININE 0.76 05/22/2023   EGFR 86 05/22/2023   GLUCOSE 106 (H) 05/22/2023   TSH 2.710 12/03/2022     The ASCVD Risk score (Arnett DK, et al., 2019) failed to calculate for the following reasons:   The valid HDL cholesterol range is 20 to 100 mg/dL  Depression, Follow-up She  was last seen for this 3 months ago. Changes made at last visit include Lexapro . She reports excellent compliance with treatment. She is not having side effects.  She reports excellent tolerance of treatment. Current symptoms include: impaired memory She feels she  is Improved since last visit.     07/31/2023    2:47 PM 04/16/2023    3:12 PM 02/04/2023    4:15 PM  Depression screen PHQ 2/9  Decreased Interest 3 2 3   Down, Depressed, Hopeless 2 0 3  PHQ - 2 Score 5 2 6   Altered sleeping 3 2 2   Tired, decreased energy 3 1 2   Change in appetite 0  0  Feeling bad or failure about yourself  0  0  Trouble concentrating 0 1 0  Moving slowly or fidgety/restless 1 1 0  Suicidal thoughts 0 0 0  PHQ-9 Score 12 7 10   Difficult doing work/chores Somewhat difficult Very difficult     Anemia The patient is a -year-old  with a history of anemia, currently managed with daily iron supplements, which she reports she has not been taking d/t GI upset. Reports still feeling less fatigued with minimal improvement in her energy levels. Recent lab results show a decrease in hemoglobin levels, indicating negative response to treatment. Will order anemia panel, as she interested in iron shot  Insomnia:  Judith Stokes  has a diagnosis of insomnia, currently managed with Trazodone  50 mg taken at bedtime as needed. She reports no adverse side effects from the medication. She is currently obtaining approximately 6-7 hours of sleep per night, which she feels is an improvement compared to her baseline prior to starting medication.  She describes difficulty with initial sleep onset, typically taking 45 minutes to 1 hour to fall asleep. Once asleep, she remains asleep through the night with occasional early morning awakening, but is usually able to return to sleep. She denies frequent nighttime awakenings, nightmares, or restless leg symptoms. She does not use caffeine in the evenings and reports maintaining a consistent sleep schedule. Stress and racing thoughts are identified as contributing factors to her difficulty initiating sleep. She denies use of alcohol, nicotine, or other substances that may affect sleep. She reports mild daytime fatigue but no significant impairment in functioning.  Overall, she is satisfied with her current sleep pattern and wishes to continue the current medication regimen.  Patient Active Problem List   Diagnosis Date Noted   Iron deficiency anemia 07/31/2023   Abnormal results of kidney function studies 05/01/2023   Severe episode of recurrent major depressive disorder, without psychotic features (HCC) 02/19/2023   Frontal sinusitis 02/19/2023   Sore throat 02/19/2023   Nasal congestion 02/19/2023   Acute cough 02/19/2023   Pain in both lower extremities 01/16/2023   Primary hypertension 12/03/2022   Postmenopausal estrogen deficiency 12/03/2022   Routine medical exam 12/03/2022   Encounter for general adult medical examination with abnormal findings 12/03/2022   Anxiety associated with depression 12/03/2022   Polydipsia 12/03/2022   Family history of diabetes mellitus in father 12/03/2022   Family history of early CAD 12/03/2022   Class 1 obesity due to excess calories without serious comorbidity with body mass index (BMI) of 31.0 to 31.9 in adult 12/03/2022   Depression 04/22/2014   Insomnia 04/22/2014   Past Medical History:  Diagnosis Date   History of blood clots    Past Surgical History:  Procedure Laterality Date   ABDOMINAL HYSTERECTOMY     GASTRIC BYPASS     HAND TENDON SURGERY Left    TONSILLECTOMY     TUBAL LIGATION     Social History   Tobacco Use   Smoking status: Never  Substance Use Topics   Alcohol use: No    Alcohol/week: 0.0 standard drinks of alcohol   Drug use: No   Social History   Socioeconomic History   Marital status: Married    Spouse name: Not on file   Number of children: Not on file   Years of education: Not on file   Highest education level: Not on file  Occupational History   Not on file  Tobacco Use   Smoking status: Never   Smokeless tobacco: Not on file  Substance and Sexual Activity   Alcohol use: No    Alcohol/week: 0.0 standard drinks of alcohol   Drug use: No   Sexual  activity: Not on file  Other Topics Concern   Not on file  Social History Narrative   Not on file   Social Drivers of Health   Financial Resource Strain: Low Risk  (02/04/2023)   Overall Financial Resource Strain (CARDIA)    Difficulty of Paying Living Expenses: Not hard at all  Food Insecurity: No Food Insecurity (02/04/2023)   Hunger Vital Sign    Worried About Running Out of Food in the Last Year: Never true    Ran Out of Food in the Last Year: Never true  Transportation Needs: No Transportation Needs (02/04/2023)   PRAPARE - Administrator, Civil Service (Medical): No    Lack of Transportation (Non-Medical): No  Physical Activity: Sufficiently Active (02/04/2023)   Exercise Vital Sign  Days of Exercise per Week: 5 days    Minutes of Exercise per Session: 60 min  Stress: No Stress Concern Present (02/04/2023)   [employer] of Occupational Health - Occupational Stress Questionnaire    Feeling of Stress : Not at all  Social Connections: Moderately Integrated (02/04/2023)   Social Connection and Isolation Panel    Frequency of Communication with Friends and Family: More than three times a week    Frequency of Social Gatherings with Friends and Family: Three times a week    Attends Religious Services: More than 4 times per year    Active Member of Clubs or Organizations: No    Attends Banker Meetings: Never    Marital Status: Married  Catering manager Violence: Not At Risk (02/04/2023)   Humiliation, Afraid, Rape, and Kick questionnaire    Fear of Current or Ex-Partner: No    Emotionally Abused: No    Physically Abused: No    Sexually Abused: No   Family Status  Relation Name Status   Mother  Deceased   Father  Deceased   Sister  Alive   Brother  Alive   Neg Hx  (Not Specified)  No partnership data on file   Family History  Problem Relation Age of Onset   Cancer Mother    COPD Father    Cancer Sister    Breast cancer Neg Hx     Allergies  Allergen Reactions   Oxycontin  [Oxycodone  Hcl] Hives   Penicillins Hives      Review of Systems  Constitutional:  Negative for chills and fever.  HENT:  Negative for ear pain and sore throat.   Respiratory:  Negative for cough, shortness of breath and wheezing.   Cardiovascular:  Negative for chest pain and leg swelling.  Gastrointestinal:  Negative for blood in stool, constipation, nausea and vomiting.  Musculoskeletal:  Negative for falls.  Skin:  Negative for itching and rash.  Neurological:  Negative for dizziness and headaches.   Negative unless indicated in HPI   Objective:     BP 105/67   Pulse 67   Temp 98.5 F (36.9 C)   Ht 5' 1.5 (1.562 m)   Wt 166 lb (75.3 kg)   SpO2 96%   BMI 30.86 kg/m  BP Readings from Last 3 Encounters:  07/31/23 105/67  04/16/23 (!) 163/78  02/19/23 (!) 175/77   Wt Readings from Last 3 Encounters:  07/31/23 166 lb (75.3 kg)  04/16/23 166 lb 3.2 oz (75.4 kg)  02/19/23 170 lb 12.8 oz (77.5 kg)      Physical Exam Vitals and nursing note reviewed.  Constitutional:      General: She is not in acute distress. HENT:     Head: Normocephalic and atraumatic.     Nose: Nose normal.     Mouth/Throat:     Mouth: Mucous membranes are moist.  Eyes:     General: No scleral icterus.    Extraocular Movements: Extraocular movements intact.     Conjunctiva/sclera: Conjunctivae normal.     Pupils: Pupils are equal, round, and reactive to light.  Cardiovascular:     Heart sounds: Normal heart sounds.  Pulmonary:     Effort: Pulmonary effort is normal.     Breath sounds: Normal breath sounds.  Musculoskeletal:        General: Normal range of motion.     Right lower leg: No edema.     Left lower leg: No edema.  Skin:  General: Skin is warm and dry.     Findings: No rash.  Neurological:     Mental Status: She is alert and oriented to person, place, and time.  Psychiatric:        Mood and Affect: Mood normal.         Behavior: Behavior normal.        Thought Content: Thought content normal.      No results found for any visits on 07/31/23.  Last CBC Lab Results  Component Value Date   WBC 3.5 12/03/2022   HGB 10.8 (L) 12/03/2022   HCT 36.2 12/03/2022   MCV 85 12/03/2022   MCH 25.3 (L) 12/03/2022   RDW 15.6 (H) 12/03/2022   PLT 326 12/03/2022   Last metabolic panel Lab Results  Component Value Date   GLUCOSE 106 (H) 05/22/2023   NA 136 05/22/2023   K 4.3 05/22/2023   CL 95 (L) 05/22/2023   CO2 25 05/22/2023   BUN 24 05/22/2023   CREATININE 0.76 05/22/2023   EGFR 86 05/22/2023   CALCIUM 10.1 05/22/2023   PROT 7.2 05/22/2023   ALBUMIN 4.6 05/22/2023   LABGLOB 2.6 05/22/2023   BILITOT 0.8 05/22/2023   ALKPHOS 102 05/22/2023   AST 18 05/22/2023   ALT 11 05/22/2023   Last lipids Lab Results  Component Value Date   CHOL 207 (H) 12/03/2022   HDL 116 12/03/2022   LDLCALC 68 12/03/2022   TRIG 140 12/03/2022   CHOLHDL 1.8 12/03/2022   Last hemoglobin A1c Lab Results  Component Value Date   HGBA1C 5.1 12/03/2022   Last thyroid  functions Lab Results  Component Value Date   TSH 2.710 12/03/2022   T4TOTAL 4.9 12/03/2022        Assessment & Plan:  Primary hypertension  Iron deficiency anemia, unspecified iron deficiency anemia type -     Anemia Profile B  Severe episode of recurrent major depressive disorder, without psychotic features (HCC)   Judith Stokes is a 68 year old Caucasian female seen today for chronic disease management, no acute distress  HTN: BP well controlled. Changes none made in regimen continue Zestoretic , no refill needed. Goal BP is 130/80. Pt aware to report any persistent high or low readings. DASH diet and exercise encouraged. Exercise at least 150 minutes per week and increase as tolerated. Goal BMI > 25. Stress management encouraged. Avoid nicotine and tobacco product use. Avoid excessive alcohol and NSAID's. Avoid more than 2000 mg of sodium daily.  Medications as prescribed. Follow up as scheduled.   Anemia: Anemia panel result pending, increase intake of iron deficiency anemia, increase intake of legumes, cruciferous vegetables such as broccoli, kale, spinach, collard green.  Depression: Continue Lexapro  10 mg daily no refill needed Insomnia: Continue trazodone  50 mg as needed no refill neede Continue healthy lifestyle choices, including diet (rich in fruits, vegetables, and lean proteins, and low in salt and simple carbohydrates) and exercise (at least 30 minutes of moderate physical activity daily).     The above assessment and management plan was discussed with the patient. The patient verbalized understanding of and has agreed to the management plan. Patient is aware to call the clinic if they develop any new symptoms or if symptoms persist or worsen. Patient is aware when to return to the clinic for a follow-up visit. Patient educated on when it is appropriate to go to the emergency department.  Return in about 4 months (around 12/01/2023).    Dhwani Venkatesh [place], [place] [facility] [address] [place], [place] [address] [phone]    Note: This document was prepared by [employer] voice dictation technology and any errors that results from this process are unintentional. d by [facility] voice dictation technology and any errors that results from this process are unintentional.

## 2023-07-31 ENCOUNTER — Encounter: Payer: Self-pay | Admitting: Nurse Practitioner

## 2023-07-31 ENCOUNTER — Ambulatory Visit: Admitting: Nurse Practitioner

## 2023-07-31 VITALS — BP 105/67 | HR 67 | Temp 98.5°F | Ht 61.5 in | Wt 166.0 lb

## 2023-07-31 DIAGNOSIS — D509 Iron deficiency anemia, unspecified: Secondary | ICD-10-CM | POA: Diagnosis not present

## 2023-07-31 DIAGNOSIS — F332 Major depressive disorder, recurrent severe without psychotic features: Secondary | ICD-10-CM

## 2023-07-31 DIAGNOSIS — I10 Essential (primary) hypertension: Secondary | ICD-10-CM | POA: Diagnosis not present

## 2023-08-01 LAB — ANEMIA PROFILE B
Basophils Absolute: 0 x10E3/uL (ref 0.0–0.2)
Basos: 1 %
EOS (ABSOLUTE): 0.1 x10E3/uL (ref 0.0–0.4)
Eos: 3 %
Ferritin: 31 ng/mL (ref 15–150)
Folate: 10.4 ng/mL (ref >3.0)
Hematocrit: 33 % — ABNORMAL LOW (ref 34.0–46.6)
Hemoglobin: 10.8 g/dL — ABNORMAL LOW (ref 11.1–15.9)
Immature Grans (Abs): 0 x10E3/uL (ref 0.0–0.1)
Immature Granulocytes: 0 %
Iron Saturation: 28 % (ref 15–55)
Iron: 119 ug/dL (ref 27–139)
Lymphocytes Absolute: 0.9 x10E3/uL (ref 0.7–3.1)
Lymphs: 18 %
MCH: 32.9 pg (ref 26.6–33.0)
MCHC: 32.7 g/dL (ref 31.5–35.7)
MCV: 101 fL — ABNORMAL HIGH (ref 79–97)
Monocytes Absolute: 0.4 x10E3/uL (ref 0.1–0.9)
Monocytes: 9 %
Neutrophils Absolute: 3.2 x10E3/uL (ref 1.4–7.0)
Neutrophils: 69 %
Platelets: 323 x10E3/uL (ref 150–450)
RBC: 3.28 x10E6/uL — ABNORMAL LOW (ref 3.77–5.28)
RDW: 14.7 % (ref 11.7–15.4)
Retic Ct Pct: 1.8 % (ref 0.6–2.6)
Total Iron Binding Capacity: 430 ug/dL (ref 250–450)
UIBC: 311 ug/dL (ref 118–369)
Vitamin B-12: 242 pg/mL (ref 232–1245)
WBC: 4.7 x10E3/uL (ref 3.4–10.8)

## 2023-08-05 ENCOUNTER — Ambulatory Visit: Payer: Self-pay | Admitting: Nurse Practitioner

## 2023-08-05 ENCOUNTER — Telehealth: Payer: Self-pay | Admitting: Family Medicine

## 2023-08-05 DIAGNOSIS — D509 Iron deficiency anemia, unspecified: Secondary | ICD-10-CM

## 2023-08-05 NOTE — Telephone Encounter (Unsigned)
 Copied from CRM [id number]. Topic: Clinical - Lab/Test Results >> Aug 05, 2023  4:27 PM Judith Stokes wrote: Reason for CRM: patient had a missed call from [facility] regarding lab results. Read lab results and patient has additional questions. The patient cannot take iron pills. Patient is requesting a callback at [phone]

## 2023-08-06 NOTE — Telephone Encounter (Signed)
 I spoke to pt and she states she can't take iron orally and would prefer to get B12 shots. I advised pt B12 repletion is not the same as iron repletion and pt declines iron supplement at this time. Offered referral to hematology to discuss iron repletion through infusion if appropriate and pt would like referral. Referral placed.

## 2023-08-24 ENCOUNTER — Other Ambulatory Visit: Payer: Self-pay | Admitting: Nurse Practitioner

## 2023-08-24 DIAGNOSIS — F5105 Insomnia due to other mental disorder: Secondary | ICD-10-CM

## 2023-09-21 ENCOUNTER — Other Ambulatory Visit: Payer: Self-pay | Admitting: Nurse Practitioner

## 2023-09-21 DIAGNOSIS — M79604 Pain in right leg: Secondary | ICD-10-CM

## 2023-09-22 ENCOUNTER — Other Ambulatory Visit: Payer: Self-pay | Admitting: Nurse Practitioner

## 2023-09-22 DIAGNOSIS — I10 Essential (primary) hypertension: Secondary | ICD-10-CM

## 2023-09-25 ENCOUNTER — Inpatient Hospital Stay: Attending: Hematology and Oncology | Admitting: Hematology and Oncology

## 2023-09-25 ENCOUNTER — Inpatient Hospital Stay

## 2023-09-25 VITALS — BP 139/77 | HR 64 | Temp 97.6°F | Resp 13 | Wt 168.0 lb

## 2023-09-25 DIAGNOSIS — D508 Other iron deficiency anemias: Secondary | ICD-10-CM

## 2023-09-25 DIAGNOSIS — E538 Deficiency of other specified B group vitamins: Secondary | ICD-10-CM | POA: Insufficient documentation

## 2023-09-25 DIAGNOSIS — K912 Postsurgical malabsorption, not elsewhere classified: Secondary | ICD-10-CM | POA: Insufficient documentation

## 2023-09-25 DIAGNOSIS — Z9884 Bariatric surgery status: Secondary | ICD-10-CM | POA: Diagnosis not present

## 2023-09-25 DIAGNOSIS — D509 Iron deficiency anemia, unspecified: Secondary | ICD-10-CM | POA: Diagnosis not present

## 2023-09-25 LAB — FOLATE: Folate: 6.5 ng/mL (ref >5.9)

## 2023-09-25 LAB — CBC WITH DIFFERENTIAL (CANCER CENTER ONLY)
Abs Immature Granulocytes: 0.02 K/uL (ref 0.00–0.07)
Basophils Absolute: 0 K/uL (ref 0.0–0.1)
Basophils Relative: 0 %
Eosinophils Absolute: 0.2 K/uL (ref 0.0–0.5)
Eosinophils Relative: 4 %
HCT: 31.6 % — ABNORMAL LOW (ref 36.0–46.0)
Hemoglobin: 10.6 g/dL — ABNORMAL LOW (ref 12.0–15.0)
Immature Granulocytes: 0 %
Lymphocytes Relative: 18 %
Lymphs Abs: 0.8 K/uL (ref 0.7–4.0)
MCH: 32 pg (ref 26.0–34.0)
MCHC: 33.5 g/dL (ref 30.0–36.0)
MCV: 95.5 fL (ref 80.0–100.0)
Monocytes Absolute: 0.5 K/uL (ref 0.1–1.0)
Monocytes Relative: 11 %
Neutro Abs: 3.1 K/uL (ref 1.7–7.7)
Neutrophils Relative %: 67 %
Platelet Count: 285 K/uL (ref 150–400)
RBC: 3.31 MIL/uL — ABNORMAL LOW (ref 3.87–5.11)
RDW: 13.5 % (ref 11.5–15.5)
WBC Count: 4.6 K/uL (ref 4.0–10.5)
nRBC: 0 % (ref 0.0–0.2)

## 2023-09-25 LAB — CMP (CANCER CENTER ONLY)
ALT: 7 U/L (ref 0–44)
AST: 13 U/L — ABNORMAL LOW (ref 15–41)
Albumin: 3.7 g/dL (ref 3.5–5.0)
Alkaline Phosphatase: 69 U/L (ref 38–126)
Anion gap: 7 (ref 5–15)
BUN: 27 mg/dL — ABNORMAL HIGH (ref 8–23)
CO2: 31 mmol/L (ref 22–32)
Calcium: 9.1 mg/dL (ref 8.9–10.3)
Chloride: 102 mmol/L (ref 98–111)
Creatinine: 0.87 mg/dL (ref 0.44–1.00)
GFR, Estimated: >60 mL/min (ref >60)
Glucose, Bld: 100 mg/dL — ABNORMAL HIGH (ref 70–99)
Potassium: 4.2 mmol/L (ref 3.5–5.1)
Sodium: 140 mmol/L (ref 135–145)
Total Bilirubin: 0.3 mg/dL (ref 0.0–1.2)
Total Protein: 6.5 g/dL (ref 6.5–8.1)

## 2023-09-25 LAB — RETIC PANEL
Immature Retic Fract: 13.2 % (ref 2.3–15.9)
RBC.: 3.28 MIL/uL — ABNORMAL LOW (ref 3.87–5.11)
Retic Count, Absolute: 40.3 K/uL (ref 19.0–186.0)
Retic Ct Pct: 1.2 % (ref 0.4–3.1)
Reticulocyte Hemoglobin: 37.5 pg (ref >27.9)

## 2023-09-25 LAB — IRON AND IRON BINDING CAPACITY (CC-WL,HP ONLY)
Iron: 104 ug/dL (ref 28–170)
Saturation Ratios: 24 % (ref 10.4–31.8)
TIBC: 430 ug/dL (ref 250–450)
UIBC: 326 ug/dL (ref 148–442)

## 2023-09-25 LAB — FERRITIN: Ferritin: 31 ng/mL (ref 11–307)

## 2023-09-25 LAB — VITAMIN B12: Vitamin B-12: 263 pg/mL (ref 180–914)

## 2023-09-25 NOTE — Progress Notes (Signed)
 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 Federico   CHIEF COMPLAINTS/PURPOSE OF CONSULTATION:   Iron Deficiency Anemia   HISTORY OF PRESENTING ILLNESS:  Judith Stokes 68 y.o. female with medical history significant for hypertension and prior gastric bypass who presents for evaluation of iron deficiency anemia.  On review of the previous records Judith Stokes had labs drawn on 07/31/2023 which showed White blood cell 4.7, hemoglobin 10.8, MCV 101, platelets 323.  Due to concern for iron deficiency anemia the patient was referred to hematology clinic for further evaluation and management.  On exam today Judith Stokes is accompanied by her husband.  She reports that she has had low hemoglobins last few time she has been checked with her doctor.  She reports that she is currently working 2 part-time jobs and always feels tired.  She notes that she has not been having lightheadedness, dizziness, shortness of breath but does have some occasional headaches.  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  She notes that she is a non-smoker but has been eating ice.  She reports that she is not having any overt signs of bleeding such as nosebleeds, gum bleeding, or dark stools.  She is postmenopausal and underwent abdominal hysterectomy.  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  She notes she has taken iron pills before in the past but they do hurt her stomach and cause constipation.  She notes that she did house to have a blood clot in the year 2000 when she had her gallbladder  out.  On further discussion she reports that her dad had anemia and also had some form of cancer.  He was treated at the [facility].  She reports that her mother had some kind of problem with her lungs.  She has 1 healthy child.  She notes that she is a never smoker but does drink a white [place] nightly.  She otherwise denies any fevers, chills, sweats, nausea, vomiting or diarrhea.  A full 10 point ROS is otherwise negative.  MEDICAL HISTORY:  Past Medical History:  Diagnosis Date   History of blood clots     SURGICAL HISTORY: Past Surgical History:  Procedure Laterality Date   ABDOMINAL HYSTERECTOMY     GASTRIC BYPASS     HAND TENDON SURGERY Left    TONSILLECTOMY     TUBAL LIGATION      SOCIAL HISTORY: Social History   Socioeconomic History   Marital status: Married    Spouse name: Not on file   Number of children: Not on file   Years of education: Not on file   Highest education level: Not on file  Occupational History   Not on file  Tobacco Use   Smoking status: Never   Smokeless tobacco: Not on file  Substance and Sexual Activity   Alcohol use: No    Alcohol/week: 0.0 standard drinks of alcohol   Drug use: No   Sexual activity: Not on file  Other Topics Concern   Not on file  Social History Narrative   Not on file   Social Drivers of Health  Financial Resource Strain: Low Risk  (02/04/2023)   Overall Financial Resource Strain (CARDIA)    Difficulty of Paying Living Expenses: Not hard at all  Food Insecurity: No Food Insecurity (02/04/2023)   Hunger Vital Sign    Worried About Running Out of Food in the Last Year: Never true    Ran Out of Food in the Last Year: Never true  Transportation Needs: No Transportation Needs (02/04/2023)   PRAPARE - Administrator, Civil Service (Medical): No    Lack of Transportation (Non-Medical): No  Physical Activity: Sufficiently Active (02/04/2023)   Exercise Vital Sign    Days of Exercise per Week: 5 days    Minutes of  Exercise per Session: 60 min  Stress: No Stress Concern Present (02/04/2023)   [employer] of Occupational Health - Occupational Stress Questionnaire    Feeling of Stress : Not at all  Social Connections: Moderately Integrated (02/04/2023)   Social Connection and Isolation Panel    Frequency of Communication with Friends and Family: More than three times a week    Frequency of Social Gatherings with Friends and Family: Three times a week    Attends Religious Services: More than 4 times per year    Active Member of Clubs or Organizations: No    Attends Banker Meetings: Never    Marital Status: Married  Catering manager Violence: Not At Risk (02/04/2023)   Humiliation, Afraid, Rape, and Kick questionnaire    Fear of Current or Ex-Partner: No    Emotionally Abused: No    Physically Abused: No    Sexually Abused: No    FAMILY HISTORY: Family History  Problem Relation Age of Onset   Cancer Mother    COPD Father    Cancer Sister    Breast cancer Neg Hx     ALLERGIES:  is allergic to oxycontin  [oxycodone  hcl] and penicillins.  MEDICATIONS:  Current Outpatient Medications  Medication Sig Dispense Refill   escitalopram  (LEXAPRO ) 20 MG tablet TAKE 1 TABLET BY MOUTH EVERY DAY 90 tablet 0   lisinopril -hydrochlorothiazide  (ZESTORETIC ) 20-12.5 MG tablet TAKE 1 TABLET BY MOUTH EVERY DAY 90 tablet 0   naproxen  (NAPROSYN ) 500 MG tablet TAKE 1 TABLET BY MOUTH TWICE A DAY AS NEEDED FOR MODERATE PAIN IN LEG AND TAKE WITH FOOD 60 tablet 0   traZODone  (DESYREL ) 50 MG tablet TAKE 1 TO 2 TABLETS AS NEEDED FOR SLEEP 180 tablet 0   No current facility-administered medications for this visit.    REVIEW OF SYSTEMS:   Constitutional: ( - ) fevers, ( - )  chills , ( - ) night sweats Eyes: ( - ) blurriness of vision, ( - ) double vision, ( - ) watery eyes Ears, nose, mouth, throat, and face: ( - ) mucositis, ( - ) sore throat Respiratory: ( - ) cough, ( - ) dyspnea, ( - )  wheezes Cardiovascular: ( - ) palpitation, ( - ) chest discomfort, ( - ) lower extremity swelling Gastrointestinal:  ( - ) nausea, ( - ) heartburn, ( - ) change in bowel habits Skin: ( - ) abnormal skin rashes Lymphatics: ( - ) new lymphadenopathy, ( - ) easy bruising Neurological: ( - ) numbness, ( - ) tingling, ( - ) new weaknesses Behavioral/Psych: ( - ) mood change, ( - ) new changes  All other systems were reviewed with the patient and are negative.  PHYSICAL EXAMINATION:  Vitals:   09/25/23 1320  BP: 139/77  Pulse:  64  Resp: 13  Temp: 97.6 F (36.4 C)  SpO2: 100%   Filed Weights   09/25/23 1320  Weight: 168 lb (76.2 kg)    GENERAL: well appearing middle-age Caucasian female in NAD  SKIN: skin color, texture, turgor are normal, no rashes or significant lesions EYES: conjunctiva are pink and non-injected, sclera clear LUNGS: clear to auscultation and percussion with normal breathing effort HEART: regular rate & rhythm and no murmurs and no lower extremity edema Musculoskeletal: no cyanosis of digits and no clubbing  PSYCH: alert & oriented x 3, fluent speech NEURO: no focal motor/sensory deficits  LABORATORY DATA:  I have reviewed the data as listed    Latest Ref Rng & Units 09/25/2023    2:09 PM 07/31/2023    3:01 PM 12/03/2022    1:08 PM  CBC  WBC 4.0 - 10.5 K/uL 4.6  4.7  3.5   Hemoglobin 12.0 - 15.0 g/dL 89.3  89.1  89.1   Hematocrit 36.0 - 46.0 % 31.6  33.0  36.2   Platelets 150 - 400 K/uL 285  323  326        Latest Ref Rng & Units 09/25/2023    2:09 PM 05/22/2023    4:00 PM 04/30/2023    4:32 PM  CMP  Glucose 70 - 99 mg/dL 899  893  93   BUN 8 - 23 mg/dL 27  24  35   Creatinine 0.44 - 1.00 mg/dL 9.12  9.23  8.91   Sodium 135 - 145 mmol/L 140  136  136   Potassium 3.5 - 5.1 mmol/L 4.2  4.3  4.6   Chloride 98 - 111 mmol/L 102  95  94   CO2 22 - 32 mmol/L 31  25  28    Calcium 8.9 - 10.3 mg/dL 9.1  89.8  9.5   Total Protein 6.5 - 8.1 g/dL 6.5  7.2    Total  Bilirubin 0.0 - 1.2 mg/dL 0.3  0.8    Alkaline Phos 38 - 126 U/L 69  102    AST 15 - 41 U/L 13  18    ALT 0 - 44 U/L 7  11       ASSESSMENT & PLAN Lanecia CHRISTELLA Dollar 69 y.o. female with medical history significant for hypertension and prior gastric bypass who presents for evaluation of iron deficiency anemia.  After review of the labs, review of the records, and discussion with the patient the patients findings are most consistent with iron deficiency anemia secondary to gastric bypass.  # Iron Deficiency Anemia 2/2 to Gastric Bypass -- Findings are consistent with iron deficiency anemia secondary to gastric bypass --Patient's who have undergone gastric bypass surgery are unable to adequately absorb iron from their diet and p.o. iron therapy.  Patient will require IV iron therapy. --We will confirm iron deficiency anemia by ordering iron panel and ferritin as well as reticulocytes, CBC, and CMP --Additionally we will check vitamin B12 levels. --Recommend holding further p.o. iron therapy.  It is poorly absorbed in the setting of gastric bypass. --We will plan to proceed with IV iron therapy in order to help bolster the patient's blood counts --Plan for return to clinic in 4 to 6 weeks time after last dose of IV iron   Orders Placed This Encounter  Procedures   CBC with Differential (Cancer Center Only)    Standing Status:   Future    Number of Occurrences:   1    Expiration  Date:   09/24/2024   CMP (Cancer Center only)    Standing Status:   Future    Number of Occurrences:   1    Expiration Date:   09/24/2024   Ferritin    Standing Status:   Future    Number of Occurrences:   1    Expiration Date:   09/24/2024   Iron and Iron Binding Capacity ([facility] only)    Standing Status:   Future    Number of Occurrences:   1    Expiration Date:   09/24/2024   Retic Panel    Standing Status:   Future    Number of Occurrences:   1    Expiration Date:   09/24/2024   Vitamin B12    Standing  Status:   Future    Number of Occurrences:   1    Expiration Date:   09/24/2024   Methylmalonic acid, serum    Standing Status:   Future    Number of Occurrences:   1    Expiration Date:   09/24/2024   Folate, Serum    Standing Status:   Future    Number of Occurrences:   1    Expiration Date:   09/24/2024    All questions were answered. The patient knows to call the clinic with any problems, questions or concerns.  A total of more than 60 minutes were spent on this encounter with face-to-face time and non-face-to-face time, including preparing to see the patient, ordering tests and/or medications, counseling the patient and coordination of care as outlined above.   Judith IVAR Kidney, MD Department of Hematology/Oncology [facility] Phone: [phone] Pager: [phone] Email: [email or web address]  09/28/2023 3:52 PM

## 2023-09-28 LAB — METHYLMALONIC ACID, SERUM: Methylmalonic Acid, Quantitative: 517 nmol/L — ABNORMAL HIGH (ref 0–378)

## 2023-09-29 ENCOUNTER — Other Ambulatory Visit: Payer: Self-pay | Admitting: Nurse Practitioner

## 2023-09-29 DIAGNOSIS — F330 Major depressive disorder, recurrent, mild: Secondary | ICD-10-CM

## 2023-10-02 ENCOUNTER — Other Ambulatory Visit: Payer: Self-pay | Admitting: Hematology and Oncology

## 2023-10-02 ENCOUNTER — Telehealth: Payer: Self-pay | Admitting: *Deleted

## 2023-10-02 DIAGNOSIS — E538 Deficiency of other specified B group vitamins: Secondary | ICD-10-CM | POA: Insufficient documentation

## 2023-10-02 NOTE — Telephone Encounter (Signed)
 Received call from pt inquiring about her lab results from last week. Per Dr. Federico, her iron levels are borderline low and her B12 is low. We will have her scheduled to start B12 injections 1 x a week for 4 weeks then monthly along with Monoferric iron infusion with her 1st B12 injection. She voiced understanding. Advised that we will schedule follow up visit to check her labs 6-8 weeks after her iron infusion  She voiced understanding.

## 2023-10-03 ENCOUNTER — Other Ambulatory Visit: Payer: Self-pay | Admitting: Hematology and Oncology

## 2023-10-06 ENCOUNTER — Inpatient Hospital Stay

## 2023-10-06 ENCOUNTER — Other Ambulatory Visit: Payer: Self-pay | Admitting: Hematology and Oncology

## 2023-10-06 VITALS — BP 147/73 | HR 59 | Temp 98.7°F | Resp 14

## 2023-10-06 DIAGNOSIS — D509 Iron deficiency anemia, unspecified: Secondary | ICD-10-CM

## 2023-10-06 DIAGNOSIS — E538 Deficiency of other specified B group vitamins: Secondary | ICD-10-CM

## 2023-10-06 MED ORDER — IRON SUCROSE 20 MG/ML IV SOLN
200.0000 mg | Freq: Once | INTRAVENOUS | Status: AC
  Administered 2023-10-06: 200 mg via INTRAVENOUS
  Filled 2023-10-06: qty 10

## 2023-10-06 MED ORDER — SODIUM CHLORIDE 0.9 % IV SOLN: INTRAVENOUS | Status: DC

## 2023-10-06 MED ORDER — IRON SUCROSE 200 MG IVPB - SIMPLE MED: 200.0000 mg | Freq: Once | Status: DC

## 2023-10-06 MED ORDER — CYANOCOBALAMIN 1000 MCG/ML IJ SOLN
1000.0000 ug | Freq: Once | INTRAMUSCULAR | Status: AC
  Administered 2023-10-06: 1000 ug via INTRAMUSCULAR
  Filled 2023-10-06: qty 1

## 2023-10-06 MED ORDER — CYANOCOBALAMIN 1000 MCG/ML IJ SOLN: 1000.0000 ug | Freq: Once | INTRAMUSCULAR | Status: DC

## 2023-10-06 NOTE — Patient Instructions (Signed)

## 2023-10-07 ENCOUNTER — Telehealth: Payer: Self-pay | Admitting: *Deleted

## 2023-10-07 NOTE — Telephone Encounter (Signed)
 Dr. Federico responded he would prefer she get the first 4 weekly shots here, then she can give the monthly ones at home. TCT patient and LVM with Dr. Lafonda response. Encouraged her to call office for any questions

## 2023-10-07 NOTE — Telephone Encounter (Signed)
 Ms. Judith Stokes called and asked if it would be ok if she gave herself the B12 injections so that she would not have to come to [facility] every week just for an injection. If so, she would like prescription for B12 sent to [facility] in [place]. Dr. Federico sent message with patient's request.  TCT patient - LVM that Dr. Federico was sent her request and that office will contact her with his response.

## 2023-10-09 ENCOUNTER — Telehealth: Payer: Self-pay | Admitting: Hematology and Oncology

## 2023-10-15 ENCOUNTER — Inpatient Hospital Stay

## 2023-10-15 DIAGNOSIS — D509 Iron deficiency anemia, unspecified: Secondary | ICD-10-CM | POA: Diagnosis not present

## 2023-10-15 DIAGNOSIS — E538 Deficiency of other specified B group vitamins: Secondary | ICD-10-CM

## 2023-10-15 MED ORDER — CYANOCOBALAMIN 1000 MCG/ML IJ SOLN
1000.0000 ug | Freq: Once | INTRAMUSCULAR | Status: AC
  Administered 2023-10-15: 1000 ug via INTRAMUSCULAR
  Filled 2023-10-15: qty 1

## 2023-10-20 ENCOUNTER — Other Ambulatory Visit: Payer: Self-pay | Admitting: Nurse Practitioner

## 2023-10-20 DIAGNOSIS — M79604 Pain in right leg: Secondary | ICD-10-CM

## 2023-10-21 ENCOUNTER — Telehealth: Payer: Self-pay | Admitting: Hematology and Oncology

## 2023-10-21 ENCOUNTER — Inpatient Hospital Stay

## 2023-10-21 DIAGNOSIS — E538 Deficiency of other specified B group vitamins: Secondary | ICD-10-CM

## 2023-10-21 DIAGNOSIS — D509 Iron deficiency anemia, unspecified: Secondary | ICD-10-CM

## 2023-10-21 MED ORDER — CYANOCOBALAMIN 1000 MCG/ML IJ SOLN
1000.0000 ug | Freq: Once | INTRAMUSCULAR | Status: AC
  Administered 2023-10-21: 1000 ug via INTRAMUSCULAR
  Filled 2023-10-21: qty 1

## 2023-10-21 NOTE — Telephone Encounter (Signed)
 Rescheduled appointment per incoming call from the patient. Talked with the patient and she is aware of the changes made to her upcoming appointment.

## 2023-10-22 ENCOUNTER — Other Ambulatory Visit: Payer: Self-pay | Admitting: Nurse Practitioner

## 2023-10-22 ENCOUNTER — Inpatient Hospital Stay

## 2023-10-22 DIAGNOSIS — F5105 Insomnia due to other mental disorder: Secondary | ICD-10-CM

## 2023-10-28 ENCOUNTER — Other Ambulatory Visit: Payer: Self-pay | Admitting: Physician Assistant

## 2023-10-29 ENCOUNTER — Ambulatory Visit

## 2023-10-30 ENCOUNTER — Other Ambulatory Visit: Payer: Self-pay | Admitting: Hematology and Oncology

## 2023-10-30 ENCOUNTER — Inpatient Hospital Stay: Attending: Hematology and Oncology

## 2023-10-30 ENCOUNTER — Inpatient Hospital Stay

## 2023-10-30 DIAGNOSIS — Z9884 Bariatric surgery status: Secondary | ICD-10-CM | POA: Diagnosis not present

## 2023-10-30 DIAGNOSIS — E538 Deficiency of other specified B group vitamins: Secondary | ICD-10-CM

## 2023-10-30 DIAGNOSIS — D509 Iron deficiency anemia, unspecified: Secondary | ICD-10-CM

## 2023-10-30 DIAGNOSIS — K912 Postsurgical malabsorption, not elsewhere classified: Secondary | ICD-10-CM | POA: Diagnosis present

## 2023-10-30 LAB — RETIC PANEL
Immature Retic Fract: 12.3 % (ref 2.3–15.9)
RBC.: 3.83 MIL/uL — ABNORMAL LOW (ref 3.87–5.11)
Retic Count, Absolute: 62 K/uL (ref 19.0–186.0)
Retic Ct Pct: 1.6 % (ref 0.4–3.1)
Reticulocyte Hemoglobin: 36.3 pg (ref >27.9)

## 2023-10-30 LAB — IRON AND IRON BINDING CAPACITY (CC-WL,HP ONLY)
Iron: 72 ug/dL (ref 28–170)
Saturation Ratios: 17 % (ref 10.4–31.8)
TIBC: 435 ug/dL (ref 250–450)
UIBC: 363 ug/dL (ref 148–442)

## 2023-10-30 LAB — CBC WITH DIFFERENTIAL (CANCER CENTER ONLY)
Abs Immature Granulocytes: 0.01 K/uL (ref 0.00–0.07)
Basophils Absolute: 0.1 K/uL (ref 0.0–0.1)
Basophils Relative: 1 %
Eosinophils Absolute: 0.2 K/uL (ref 0.0–0.5)
Eosinophils Relative: 5 %
HCT: 37.2 % (ref 36.0–46.0)
Hemoglobin: 12.4 g/dL (ref 12.0–15.0)
Immature Granulocytes: 0 %
Lymphocytes Relative: 20 %
Lymphs Abs: 0.9 K/uL (ref 0.7–4.0)
MCH: 31.6 pg (ref 26.0–34.0)
MCHC: 33.3 g/dL (ref 30.0–36.0)
MCV: 94.7 fL (ref 80.0–100.0)
Monocytes Absolute: 0.4 K/uL (ref 0.1–1.0)
Monocytes Relative: 9 %
Neutro Abs: 2.7 K/uL (ref 1.7–7.7)
Neutrophils Relative %: 65 %
Platelet Count: 265 K/uL (ref 150–400)
RBC: 3.93 MIL/uL (ref 3.87–5.11)
RDW: 14.9 % (ref 11.5–15.5)
WBC Count: 4.2 K/uL (ref 4.0–10.5)
nRBC: 0 % (ref 0.0–0.2)

## 2023-10-30 LAB — CMP (CANCER CENTER ONLY)
ALT: 7 U/L (ref 0–44)
AST: 14 U/L — ABNORMAL LOW (ref 15–41)
Albumin: 4 g/dL (ref 3.5–5.0)
Alkaline Phosphatase: 91 U/L (ref 38–126)
Anion gap: 6 (ref 5–15)
BUN: 22 mg/dL (ref 8–23)
CO2: 34 mmol/L — ABNORMAL HIGH (ref 22–32)
Calcium: 9.7 mg/dL (ref 8.9–10.3)
Chloride: 102 mmol/L (ref 98–111)
Creatinine: 0.89 mg/dL (ref 0.44–1.00)
GFR, Estimated: >60 mL/min (ref >60)
Glucose, Bld: 81 mg/dL (ref 70–99)
Potassium: 4 mmol/L (ref 3.5–5.1)
Sodium: 142 mmol/L (ref 135–145)
Total Bilirubin: 0.3 mg/dL (ref 0.0–1.2)
Total Protein: 7.1 g/dL (ref 6.5–8.1)

## 2023-10-30 LAB — VITAMIN B12: Vitamin B-12: 583 pg/mL (ref 180–914)

## 2023-10-30 LAB — FERRITIN: Ferritin: 52 ng/mL (ref 11–307)

## 2023-10-30 MED ORDER — CYANOCOBALAMIN 1000 MCG/ML IJ SOLN
1000.0000 ug | Freq: Once | INTRAMUSCULAR | Status: AC
  Administered 2023-10-30: 1000 ug via INTRAMUSCULAR
  Filled 2023-10-30: qty 1

## 2023-11-07 ENCOUNTER — Inpatient Hospital Stay (HOSPITAL_BASED_OUTPATIENT_CLINIC_OR_DEPARTMENT_OTHER): Admitting: Hematology and Oncology

## 2023-11-07 VITALS — BP 128/80 | HR 61 | Temp 97.6°F | Resp 13 | Wt 168.8 lb

## 2023-11-07 DIAGNOSIS — E538 Deficiency of other specified B group vitamins: Secondary | ICD-10-CM | POA: Diagnosis not present

## 2023-11-07 DIAGNOSIS — D509 Iron deficiency anemia, unspecified: Secondary | ICD-10-CM

## 2023-11-07 MED ORDER — CYANOCOBALAMIN 1000 MCG/ML IJ SOLN: 1000.0000 ug | INTRAMUSCULAR | 11 refills | Status: AC

## 2023-11-07 NOTE — Progress Notes (Signed)
 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 Federico   Interval History:  Judith Stokes 68 y.o. female with medical history significant for iron  deficiency anemia who presents for a follow up visit. The patient's last visit was on 09/25/2023 at which time she establish care. In the interim since the last visit she has received 3 of her vitamin B12 shots.  On exam today Judith Stokes reports that she is tolerating her shots well.  She notes that he feels her energy is improving somewhat, but is still quite low.  She reports her energy about a 1 or 2 out of 10 today.  She has a wake up at 4 AM to work.  She reports she is not having any lightheadedness, dizziness, shortness of breath.  She denies any bleeding, bruising, or dark stools.  Overall she notes that she is eating okay but her appetite does go up-and-down.  She is not having any overt signs of bleeding, bruising, or dark stools.  A full 10 point ROS otherwise negative.  MEDICAL HISTORY:  Past Medical History:  Diagnosis Date   History of blood clots     SURGICAL HISTORY: Past Surgical History:  Procedure Laterality Date   ABDOMINAL HYSTERECTOMY     GASTRIC BYPASS     HAND TENDON SURGERY Left    TONSILLECTOMY     TUBAL LIGATION      SOCIAL HISTORY: Social History   Socioeconomic History   Marital status: Married    Spouse name: Not on file   Number of children: Not on file   Years of education: Not on file   Highest education level: Not on file  Occupational History   Not on file  Tobacco Use   Smoking status: Never   Smokeless tobacco: Not on file  Substance and Sexual Activity   Alcohol use: No     Alcohol/week: 0.0 standard drinks of alcohol   Drug use: No   Sexual activity: Not on file  Other Topics Concern   Not on file  Social History Narrative   Not on file   Social Drivers of Health   Financial Resource Strain: Low Risk  (02/04/2023)   Overall Financial Resource Strain (CARDIA)    Difficulty of Paying Living Expenses: Not hard at all  Food Insecurity: No Food Insecurity (02/04/2023)   Hunger Vital Sign    Worried About Running Out of Food in the Last Year: Never true    Ran Out of Food in the Last Year: Never true  Transportation Needs: No Transportation Needs (02/04/2023)   PRAPARE - Administrator, Civil Service (Medical): No    Lack of Transportation (Non-Medical): No  Physical Activity: Sufficiently Active (02/04/2023)   Exercise Vital Sign    Days of Exercise per Week: 5 days    Minutes of Exercise per Session: 60 min  Stress: No Stress Concern Present (02/04/2023)   [employer] of Occupational Health - Occupational Stress Questionnaire    Feeling of Stress : Not at all  Social Connections: Moderately Integrated (02/04/2023)   Social Connection and Isolation Panel    Frequency of Communication with Friends and Family: More than three times a week    Frequency  of Social Gatherings with Friends and Family: Three times a week    Attends Religious Services: More than 4 times per year    Active Member of Clubs or Organizations: No    Attends Banker Meetings: Never    Marital Status: Married  Catering Manager Violence: Not At Risk (02/04/2023)   Humiliation, Afraid, Rape, and Kick questionnaire    Fear of Current or Ex-Partner: No    Emotionally Abused: No    Physically Abused: No    Sexually Abused: No    FAMILY HISTORY: Family History  Problem Relation Age of Onset   Cancer Mother    COPD Father    Cancer Sister    Breast cancer Neg Hx     ALLERGIES:  is allergic to oxycontin  [oxycodone  hcl] and penicillins.  MEDICATIONS:   Current Outpatient Medications  Medication Sig Dispense Refill   cyanocobalamin  (VITAMIN B12) 1000 MCG/ML injection Inject 1 mL (1,000 mcg total) into the muscle every 30 (thirty) days. 1 mL 11   escitalopram  (LEXAPRO ) 20 MG tablet TAKE 1 TABLET BY MOUTH EVERY DAY 90 tablet 0   lisinopril -hydrochlorothiazide  (ZESTORETIC ) 20-12.5 MG tablet TAKE 1 TABLET BY MOUTH EVERY DAY 90 tablet 0   naproxen  (NAPROSYN ) 500 MG tablet TAKE 1 TABLET BY MOUTH TWICE A DAY AS NEEDED FOR MODERATE LEG PAIN/TAKE WITH FOOD 60 tablet 0   traZODone  (DESYREL ) 50 MG tablet TAKE 1 TO 2 TABLETS BY MOUTH AS NEEDED FOR SLEEP 180 tablet 0   No current facility-administered medications for this visit.    REVIEW OF SYSTEMS:   Constitutional: ( - ) fevers, ( - )  chills , ( - ) night sweats Eyes: ( - ) blurriness of vision, ( - ) double vision, ( - ) watery eyes Ears, nose, mouth, throat, and face: ( - ) mucositis, ( - ) sore throat Respiratory: ( - ) cough, ( - ) dyspnea, ( - ) wheezes Cardiovascular: ( - ) palpitation, ( - ) chest discomfort, ( - ) lower extremity swelling Gastrointestinal:  ( - ) nausea, ( - ) heartburn, ( - ) change in bowel habits Skin: ( - ) abnormal skin rashes Lymphatics: ( - ) new lymphadenopathy, ( - ) easy bruising Neurological: ( - ) numbness, ( - ) tingling, ( - ) new weaknesses Behavioral/Psych: ( - ) mood change, ( - ) new changes  All other systems were reviewed with the patient and are negative.  PHYSICAL EXAMINATION:  Vitals:   11/07/23 1406  BP: 128/80  Pulse: 61  Resp: 13  Temp: 97.6 F (36.4 C)  SpO2: 99%   Filed Weights   11/07/23 1406  Weight: 168 lb 12.8 oz (76.6 kg)    GENERAL: Well-appearing elderly Caucasian female, alert, no distress and comfortable SKIN: skin color, texture, turgor are normal, no rashes or significant lesions EYES: conjunctiva are pink and non-injected, sclera clear LUNGS: clear to auscultation and percussion with normal breathing effort HEART:  regular rate & rhythm and no murmurs and no lower extremity edema Musculoskeletal: no cyanosis of digits and no clubbing  PSYCH: alert & oriented x 3, fluent speech NEURO: no focal motor/sensory deficits  LABORATORY DATA:  I have reviewed the data as listed    Latest Ref Rng & Units 10/30/2023    1:01 PM 09/25/2023    2:09 PM 07/31/2023    3:01 PM  CBC  WBC 4.0 - 10.5 K/uL 4.2  4.6  4.7   Hemoglobin 12.0 - 15.0  g/dL 87.5  89.3  89.1   Hematocrit 36.0 - 46.0 % 37.2  31.6  33.0   Platelets 150 - 400 K/uL 265  285  323        Latest Ref Rng & Units 10/30/2023    1:01 PM 09/25/2023    2:09 PM 05/22/2023    4:00 PM  CMP  Glucose 70 - 99 mg/dL 81  899  893   BUN 8 - 23 mg/dL 22  27  24    Creatinine 0.44 - 1.00 mg/dL 9.10  9.12  9.23   Sodium 135 - 145 mmol/L 142  140  136   Potassium 3.5 - 5.1 mmol/L 4.0  4.2  4.3   Chloride 98 - 111 mmol/L 102  102  95   CO2 22 - 32 mmol/L 34  31  25   Calcium 8.9 - 10.3 mg/dL 9.7  9.1  89.8   Total Protein 6.5 - 8.1 g/dL 7.1  6.5  7.2   Total Bilirubin 0.0 - 1.2 mg/dL 0.3  0.3  0.8   Alkaline Phos 38 - 126 U/L 91  69  102   AST 15 - 41 U/L 14  13  18    ALT 0 - 44 U/L 7  7  11      RADIOGRAPHIC STUDIES: No results found.  ASSESSMENT & PLAN Judith Stokes 68 y.o. female with medical history significant for iron  deficiency anemia who presents for a follow up visit.  After review of the labs, review of the records, and discussion with the patient the patients findings are most consistent with iron  deficiency anemia secondary to gastric bypass.   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 iron  therapy.  Patient will require IV iron  therapy. --labs today show white blood cell 4.2, hemoglobin 12.4, MCV  94.7, platelets 265  --Recommend holding further p.o. iron  therapy.  It is poorly absorbed in the setting of gastric bypass. --We will plan to proceed with IV iron  therapy in order to help bolster the patient's blood counts if she has persistent iron  deficiency. --RTC in 3 months for labs and 6 months for clinic visit   No orders of the defined types were placed in this encounter.   All questions were answered. The patient knows to call the clinic with any problems, questions or concerns.  A total of more than 30 minutes were spent on this encounter with face-to-face time and non-face-to-face time, including preparing to see the patient, ordering tests and/or medications, counseling the patient and coordination of care as outlined above.   Norleen IVAR Kidney, MD Department of Hematology/Oncology [facility] Phone: [phone] Pager: [phone] Email: [email or web address]  11/16/2023 5:04 PM

## 2023-11-18 ENCOUNTER — Other Ambulatory Visit: Payer: Self-pay | Admitting: Nurse Practitioner

## 2023-11-18 DIAGNOSIS — M79604 Pain in right leg: Secondary | ICD-10-CM

## 2023-11-27 NOTE — Progress Notes (Signed)
 =    Subjective:  Patient ID: Judith Stokes, female    DOB: [birth year], 68 y.o.   MRN: [id number]  Patient Care Team: Deitra Morton Sebastian Nena, NP as PCP - General (Nurse Practitioner)   Chief Complaint:  4 month follow up   HPI: Judith Stokes is a 68 y.o. female presenting on 12/01/2023 for 4 month follow up   Discussed the use of AI scribe software for clinical note transcription with the patient, who gave verbal consent to proceed.  History of Present Illness Judith Stokes is a 68 year old female who presents for a three-month follow-up for chronic disease management.  She has been managing her vitamin B12 deficiency with monthly self-administered injections after initially receiving weekly injections for a month. Despite treatment, she does not feel more energized and sometimes feels worse after taking the B12. Her B12 levels were checked on October 30, 2023, and were normal at 583.  She has been receiving treatment for iron  deficiency anemia, including an infusion and subsequent weekly shots. Recent lab work showed improvement in her red blood cell count from 3.28 to 3.83 over the past two months, although it remains low. Her hemoglobin was 10.6 and hematocrit was 36.6 two months ago. She has an upcoming appointment with her oncologist in January.  She reported rushing to the appointment, which she thought might have affected her blood pressure. She is currently taking trazodone  50 mg at bedtime and escitalopram  20 mg daily for depression. She needs refills for her depression and blood pressure medications.  She experienced a sore throat and ear pain recently, which she associates with being out in the rain while running errands. She also reports increased anxiety during November due to personal anniversaries and family birthdays, which is a difficult time for her emotionally.  Socially, she has been working at a diner for nearly two years and does not smoke. She wants to visit her  family in [place] , particularly her older sister who has significant health issues, including a history of breast cancer and current stomach cancer.       12/01/2023    3:29 PM 12/01/2023    3:10 PM 10/06/2023    7:55 AM  PHQ9 SCORE ONLY  PHQ-9 Total Score 5 0 0       12/01/2023    3:30 PM 07/31/2023    2:47 PM 04/16/2023    3:16 PM 12/03/2022   12:42 PM  GAD 7 : Generalized Anxiety Score  Nervous, Anxious, on Edge 1 3 1 3   Control/stop worrying 1 2 1 3   Worry too much - different things 2 1 1 3   Trouble relaxing 2 0 0 3  Restless 2 1 0 3  Easily annoyed or irritable 1 2 1 2   Afraid - awful might happen 1 0 1 0  Total GAD 7 Score 10 9 5 17   Anxiety Difficulty Somewhat difficult Somewhat difficult Somewhat difficult Not difficult at all       Relevant past medical, surgical, family, and social history reviewed and updated as indicated.  Allergies and medications reviewed and updated. Data reviewed: Chart in Epic.   Past Medical History:  Diagnosis Date   History of blood clots     Past Surgical History:  Procedure Laterality Date   ABDOMINAL HYSTERECTOMY     GASTRIC BYPASS     HAND TENDON SURGERY Left    TONSILLECTOMY     TUBAL LIGATION      Social History  Socioeconomic History   Marital status: Married    Spouse name: Not on file   Number of children: Not on file   Years of education: Not on file   Highest education level: Not on file  Occupational History   Not on file  Tobacco Use   Smoking status: Never   Smokeless tobacco: Not on file  Substance and Sexual Activity   Alcohol use: No    Alcohol/week: 0.0 standard drinks of alcohol   Drug use: No   Sexual activity: Not on file  Other Topics Concern   Not on file  Social History Narrative   Not on file   Social Drivers of Health   Financial Resource Strain: Low Risk  (02/04/2023)   Overall Financial Resource Strain (CARDIA)    Difficulty of Paying Living Expenses: Not hard at all  Food  Insecurity: No Food Insecurity (02/04/2023)   Hunger Vital Sign    Worried About Running Out of Food in the Last Year: Never true    Ran Out of Food in the Last Year: Never true  Transportation Needs: No Transportation Needs (02/04/2023)   PRAPARE - Administrator, Civil Service (Medical): No    Lack of Transportation (Non-Medical): No  Physical Activity: Sufficiently Active (02/04/2023)   Exercise Vital Sign    Days of Exercise per Week: 5 days    Minutes of Exercise per Session: 60 min  Stress: No Stress Concern Present (02/04/2023)   [employer] of Occupational Health - Occupational Stress Questionnaire    Feeling of Stress : Not at all  Social Connections: Moderately Integrated (02/04/2023)   Social Connection and Isolation Panel    Frequency of Communication with Friends and Family: More than three times a week    Frequency of Social Gatherings with Friends and Family: Three times a week    Attends Religious Services: More than 4 times per year    Active Member of Clubs or Organizations: No    Attends Banker Meetings: Never    Marital Status: Married  Catering Manager Violence: Not At Risk (02/04/2023)   Humiliation, Afraid, Rape, and Kick questionnaire    Fear of Current or Ex-Partner: No    Emotionally Abused: No    Physically Abused: No    Sexually Abused: No    Outpatient Encounter Medications as of 12/01/2023  Medication Sig   cyanocobalamin  (VITAMIN B12) 1000 MCG/ML injection Inject 1 mL (1,000 mcg total) into the muscle every 30 (thirty) days.   naproxen  (NAPROSYN ) 500 MG tablet TAKE 1 TABLET BY MOUTH TWICE A DAY AS NEEDED FOR MODERATE LEG PAIN/TAKE WITH FOOD   [DISCONTINUED] lisinopril -hydrochlorothiazide  (ZESTORETIC ) 20-12.5 MG tablet TAKE 1 TABLET BY MOUTH EVERY DAY   [DISCONTINUED] traZODone  (DESYREL ) 50 MG tablet TAKE 1 TO 2 TABLETS BY MOUTH AS NEEDED FOR SLEEP   escitalopram  (LEXAPRO ) 20 MG tablet Take 1 tablet (20 mg total) by mouth  daily.   lisinopril -hydrochlorothiazide  (ZESTORETIC ) 20-12.5 MG tablet Take 1 tablet by mouth daily.   traZODone  (DESYREL ) 50 MG tablet TAKE 1 TO 2 TABLETS BY MOUTH AS NEEDED FOR SLEEP   [DISCONTINUED] escitalopram  (LEXAPRO ) 20 MG tablet TAKE 1 TABLET BY MOUTH EVERY DAY (Patient not taking: Reported on 12/01/2023)   No facility-administered encounter medications on file as of 12/01/2023.    Allergies  Allergen Reactions   Oxycontin  [Oxycodone  Hcl] Hives   Penicillins Hives    Pertinent ROS per HPI, otherwise unremarkable      Objective:  BP 101/60   Pulse 73   Temp (!) 97.5 F (36.4 C)   Ht 5' 1.5 (1.562 m)   Wt 167 lb 12.8 oz (76.1 kg)   SpO2 97%   BMI 31.19 kg/m    Wt Readings from Last 3 Encounters:  12/01/23 167 lb 12.8 oz (76.1 kg)  11/07/23 168 lb 12.8 oz (76.6 kg)  09/25/23 168 lb (76.2 kg)   BP Readings from Last 3 Encounters:  12/01/23 101/60  11/07/23 128/80  10/06/23 (!) 147/73     Physical Exam Physical Exam      Results for orders placed or performed in visit on 10/30/23  Vitamin B12   Collection Time: 10/30/23  1:01 PM  Result Value Ref Range   Vitamin B-12 583 180 - 914 pg/mL  Retic Panel   Collection Time: 10/30/23  1:01 PM  Result Value Ref Range   Retic Ct Pct 1.6 0.4 - 3.1 %   RBC. 3.83 (L) 3.87 - 5.11 MIL/uL   Retic Count, Absolute 62.0 19.0 - 186.0 K/uL   Immature Retic Fract 12.3 2.3 - 15.9 %   Reticulocyte Hemoglobin 36.3 >27.9 pg  Iron  and Iron  Binding Capacity ([facility] only)   Collection Time: 10/30/23  1:01 PM  Result Value Ref Range   Iron  72 28 - 170 ug/dL   TIBC 564 749 - 549 ug/dL   Saturation Ratios 17 10.4 - 31.8 %   UIBC 363 148 - 442 ug/dL  Ferritin   Collection Time: 10/30/23  1:01 PM  Result Value Ref Range   Ferritin 52 11 - 307 ng/mL  CMP (Cancer Center only)   Collection Time: 10/30/23  1:01 PM  Result Value Ref Range   Sodium 142 135 - 145 mmol/L   Potassium 4.0 3.5 - 5.1 mmol/L   Chloride 102 98 -  111 mmol/L   CO2 34 (H) 22 - 32 mmol/L   Glucose, Bld 81 70 - 99 mg/dL   BUN 22 8 - 23 mg/dL   Creatinine 9.10 9.55 - 1.00 mg/dL   Calcium 9.7 8.9 - 89.6 mg/dL   Total Protein 7.1 6.5 - 8.1 g/dL   Albumin 4.0 3.5 - 5.0 g/dL   AST 14 (L) 15 - 41 U/L   ALT 7 0 - 44 U/L   Alkaline Phosphatase 91 38 - 126 U/L   Total Bilirubin 0.3 0.0 - 1.2 mg/dL   GFR, Estimated >39 >39 mL/min   Anion gap 6 5 - 15  CBC with Differential (Cancer Center Only)   Collection Time: 10/30/23  1:01 PM  Result Value Ref Range   WBC Count 4.2 4.0 - 10.5 K/uL   RBC 3.93 3.87 - 5.11 MIL/uL   Hemoglobin 12.4 12.0 - 15.0 g/dL   HCT 62.7 63.9 - 53.9 %   MCV 94.7 80.0 - 100.0 fL   MCH 31.6 26.0 - 34.0 pg   MCHC 33.3 30.0 - 36.0 g/dL   RDW 85.0 88.4 - 84.4 %   Platelet Count 265 150 - 400 K/uL   nRBC 0.0 0.0 - 0.2 %   Neutrophils Relative % 65 %   Neutro Abs 2.7 1.7 - 7.7 K/uL   Lymphocytes Relative 20 %   Lymphs Abs 0.9 0.7 - 4.0 K/uL   Monocytes Relative 9 %   Monocytes Absolute 0.4 0.1 - 1.0 K/uL   Eosinophils Relative 5 %   Eosinophils Absolute 0.2 0.0 - 0.5 K/uL   Basophils Relative 1 %   Basophils Absolute  0.1 0.0 - 0.1 K/uL   Immature Granulocytes 0 %   Abs Immature Granulocytes 0.01 0.00 - 0.07 K/uL       Pertinent labs & imaging results that were available during my care of the patient were reviewed by me and considered in my medical decision making.  Assessment & Plan:  Judith Stokes was seen today for 4 month follow up.  Diagnoses and all orders for this visit:  Primary hypertension -     lisinopril -hydrochlorothiazide  (ZESTORETIC ) 20-12.5 MG tablet; Take 1 tablet by mouth daily.  Insomnia due to other mental disorder -     escitalopram  (LEXAPRO ) 20 MG tablet; Take 1 tablet (20 mg total) by mouth daily. -     traZODone  (DESYREL ) 50 MG tablet; TAKE 1 TO 2 TABLETS BY MOUTH AS NEEDED FOR SLEEP  Severe episode of recurrent major depressive disorder, without psychotic features (HCC)  Vitamin B12  deficiency  Mild episode of recurrent major depressive disorder -     escitalopram  (LEXAPRO ) 20 MG tablet; Take 1 tablet (20 mg total) by mouth daily.     Assessment and Plan Judith Stokes is a 68 year old Caucasian female seen today for chronic disease management, no acute distress Assessment & Plan Chronic Disease Management Blood pressure elevated, likely due to anemia and rushing. B12 levels normalized with monthly injections. - Continue current management plan for chronic diseases. - Monitor blood pressure and anemia status.  Primary Hypertension Blood pressure elevated at 101/60 - Continue Zestoretic  20-12.5 mg daily.  Major Depressive Disorder and Insomnia Increased anxiety due to upcoming events. Continues escitalopram  and trazodone . No longer on Lexapro . - Continue escitalopram  20 mg daily. - Continue trazodone  50 mg at bedtime. - Refilled escitalopram  prescription.  Iron  Deficiency Anemia Improved blood cell counts. Hemoglobin and hematocrit levels improved. - Follow up with oncology in January.  Vitamin B12 Deficiency B12 levels normalized with monthly injections. No energy improvement reported. - Continue monthly B12 injections. - Use 1 to 1.5 inch needle for intramuscular administration.      Continue all other maintenance medications.  Follow up plan: Return in about 4 months (around 03/30/2024).   Continue healthy lifestyle choices, including diet (rich in fruits, vegetables, and lean proteins, and low in salt and simple carbohydrates) and exercise (at least 30 minutes of moderate physical activity daily).  Educational handout given for    Clinical References  Insomnia Insomnia is a sleep disorder that makes it difficult to fall asleep or stay asleep. Insomnia can cause fatigue, low energy, difficulty concentrating, mood swings, and poor performance at work or school. There are three different ways to classify insomnia: Difficulty falling asleep. Difficulty  staying asleep. Waking up too early in the morning. Any type of insomnia can be long-term (chronic) or short-term (acute). Both are common. Short-term insomnia usually lasts for 3 months or less. Chronic insomnia occurs at least three times a week for longer than 3 months. What are the causes? Insomnia may be caused by another condition, situation, or substance, such as: Having certain mental health conditions, such as anxiety and depression. Using caffeine, alcohol, tobacco, or drugs. Having gastrointestinal conditions, such as gastroesophageal reflux disease (GERD). Having certain medical conditions. These include: Asthma. Alzheimer's disease. Stroke. Chronic pain. An overactive thyroid  gland (hyperthyroidism). Other sleep disorders, such as restless legs syndrome and sleep apnea. Menopause. Sometimes, the cause of insomnia may not be known. What increases the risk? Risk factors for insomnia include: Gender. Females are affected more often than males. Age. Insomnia is more common  as people get older. Stress and certain medical and mental health conditions. Lack of exercise. Having an irregular work schedule. This may include working night shifts and traveling between different time zones. What are the signs or symptoms? If you have insomnia, the main symptom is having trouble falling asleep or having trouble staying asleep. This may lead to other symptoms, such as: Feeling tired or having low energy. Feeling nervous about going to sleep. Not feeling rested in the morning. Having trouble concentrating. Feeling irritable, anxious, or depressed. How is this diagnosed? This condition may be diagnosed based on: Your symptoms and medical history. Your health care provider may ask about: Your sleep habits. Any medical conditions you have. Your mental health. A physical exam. How is this treated? Treatment for insomnia depends on the cause. Treatment may focus on treating an  underlying condition that is causing the insomnia. Treatment may also include: Medicines to help you sleep. Counseling or therapy. Lifestyle adjustments to help you sleep better. Follow these instructions at home: Eating and drinking  Limit or avoid alcohol, caffeinated beverages, and products that contain nicotine and tobacco, especially close to bedtime. These can disrupt your sleep. Do not eat a large meal or eat spicy foods right before bedtime. This can lead to digestive discomfort that can make it hard for you to sleep. Sleep habits  Keep a sleep diary to help you and your health care provider figure out what could be causing your insomnia. Write down: When you sleep. When you wake up during the night. How well you sleep and how rested you feel the next day. Any side effects of medicines you are taking. What you eat and drink. Make your bedroom a dark, comfortable place where it is easy to fall asleep. Put up shades or blackout curtains to block light from outside. Use a white noise machine to block noise. Keep the temperature cool. Limit screen use before bedtime. This includes: Not watching TV. Not using your smartphone, tablet, or computer. Stick to a routine that includes going to bed and waking up at the same times every day and night. This can help you fall asleep faster. Consider making a quiet activity, such as reading, part of your nighttime routine. Try to avoid taking naps during the day so that you sleep better at night. Get out of bed if you are still awake after 15 minutes of trying to sleep. Keep the lights down, but try reading or doing a quiet activity. When you feel sleepy, go back to bed. General instructions Take over-the-counter and prescription medicines only as told by your health care provider. Exercise regularly as told by your health care provider. However, avoid exercising in the hours right before bedtime. Use relaxation techniques to manage stress. Ask  your health care provider to suggest some techniques that may work well for you. These may include: Breathing exercises. Routines to release muscle tension. Visualizing peaceful scenes. Make sure that you drive carefully. Do not drive if you feel very sleepy. Keep all follow-up visits. This is important. Contact a health care provider if: You are tired throughout the day. You have trouble in your daily routine due to sleepiness. You continue to have sleep problems, or your sleep problems get worse. Get help right away if: You have thoughts about hurting yourself or someone else. Get help right away if you feel like you may hurt yourself or others, or have thoughts about taking your own life. Go to your nearest emergency room or:  Call 911. Call the National Suicide Prevention Lifeline at (929) 144-7609 or 988. This is open 24 hours a day. Text the Crisis Text Line at 209-235-6949. Summary Insomnia is a sleep disorder that makes it difficult to fall asleep or stay asleep. Insomnia can be long-term (chronic) or short-term (acute). Treatment for insomnia depends on the cause. Treatment may focus on treating an underlying condition that is causing the insomnia. Keep a sleep diary to help you and your health care provider figure out what could be causing your insomnia. This information is not intended to replace advice given to you by your health care provider. Make sure you discuss any questions you have with your health care provider. Document Revised: 12/18/2020 Document Reviewed: 12/18/2020 Elsevier Patient Education  2024 Else[employer] Inc. Managing Depression, Adult Depression is a mental health condition that affects your thoughts, feelings, and actions. Being diagnosed with depression can bring you relief if you did not know why you have felt or behaved a certain way. It could also leave you feeling overwhelmed. Finding ways to manage your symptoms can help you feel more positive about your  future. How to manage lifestyle changes Being depressed is difficult. Depression can increase the level of everyday stress. Stress can make depression symptoms worse. You may believe your symptoms cannot be managed or will never improve. However, there are many things you can try to help manage your symptoms. There is hope. Managing stress  Stress is your body's reaction to life changes and events, both good and bad. Stress can add to your feelings of depression. Learning to manage your stress can help lessen your feelings of depression. Try some of the following approaches to reducing your stress (stress reduction techniques): Listen to music that you enjoy and that inspires you. Try using a meditation app or take a meditation class. Develop a practice that helps you connect with your spiritual self. Walk in nature, pray, or go to a place of worship. Practice deep breathing. To do this, inhale slowly through your nose. Pause at the top of your inhale for a few seconds and then exhale slowly, letting yourself relax. Repeat this three or four times. Practice yoga to help relax and work your muscles. Choose a stress reduction technique that works for you. These techniques take time and practice to develop. Set aside 5-15 minutes a day to do them. Therapists can offer training in these techniques. Do these things to help manage stress: Keep a journal. Know your limits. Set healthy boundaries for yourself and others, such as saying no when you think something is too much. Pay attention to how you react to certain situations. You may not be able to control everything, but you can change your reaction. Add humor to your life by watching funny movies or shows. Make time for activities that you enjoy and that relax you. Spend less time using electronics, especially at night before bed. The light from screens can make your brain think it is time to get up rather than go to bed.   Medicines Medicines,  such as antidepressants, are often a part of treatment for depression. Talk with your pharmacist or health care provider about all the medicines, supplements, and herbal products that you take, their possible side effects, and what medicines and other products are safe to take together. Make sure to report any side effects you may have to your health care provider. Relationships Your health care provider may suggest family therapy, couples therapy, or individual therapy as part  of your treatment. How to recognize changes Everyone responds differently to treatment for depression. As you recover from depression, you may start to: Have more interest in doing activities. Feel more hopeful. Have more energy. Eat a more regular amount of food. Have better mental focus. It is important to recognize if your depression is not getting better or is getting worse. The symptoms you had in the beginning may return, such as: Feeling tired. Eating too much or too little. Sleeping too much or too little. Feeling restless, agitated, or hopeless. Trouble focusing or making decisions. Having unexplained aches and pains. Feeling irritable, angry, or aggressive. If you or your family members notice these symptoms coming back, let your health care provider know right away. Follow these instructions at home: Activity Try to get some form of exercise each day, such as walking. Try yoga, mindfulness, or other stress reduction techniques. Participate in group activities if you are able. Lifestyle Get enough sleep. Cut down on or stop using caffeine, tobacco, alcohol, and any other harmful substances. Eat a healthy diet that includes plenty of vegetables, fruits, whole grains, low-fat dairy products, and lean protein. Limit foods that are high in solid fats, added sugar, or salt (sodium). General instructions Take over-the-counter and prescription medicines only as told by your health care provider. Keep all  follow-up visits. It is important for your health care provider to check on your mood, behavior, and medicines. Your health care provider may need to make changes to your treatment. Where to find support Talking to others  Friends and family members can be sources of support and guidance. Talk to trusted friends or family members about your condition. Explain your symptoms and let them know that you are working with a health care provider to treat your depression. Tell friends and family how they can help. Finances Find mental health providers that fit with your financial situation. Talk with your health care provider if you are worried about access to food, housing, or medicine. Call your insurance company to learn about your co-pays and prescription plan. Where to find more information You can find support in your area from: Anxiety and Depression Association of America (ADAA): adaa.org Mental Health [place]: mentalhealthamerica.net [employer] on Mental Illness: nami.org Contact a health care provider if: You stop taking your antidepressant medicines, and you have any of these symptoms: Nausea. Headache. Light-headedness. Chills and body aches. Not being able to sleep (insomnia). You or your friends and family think your depression is getting worse. Get help right away if: You have thoughts of hurting yourself or others. Get help right away if you feel like you may hurt yourself or others, or have thoughts about taking your own life. Go to your nearest emergency room or: Call 911. Call the National Suicide Prevention Lifeline at 6705791443 or 988. This is open 24 hours a day. Text the Crisis Text Line at (434) 600-5946. This information is not intended to replace advice given to you by your health care provider. Make sure you discuss any questions you have with your health care provider. Document Revised: 05/15/2021 Document Reviewed: 05/15/2021 Elsevier Patient Education  2024  Elsevier Inc. Blood Pressure Record Sheet To take your blood pressure, you will need a blood pressure machine. You may be prescribed one, or you can buy a blood pressure machine (blood pressure monitor) at your clinic, drug store, or online. When choosing one, look for these features: An automatic monitor that has an arm cuff. A cuff that wraps snugly, but  not too tightly, around your upper arm. You should be able to fit only one finger between your arm and the cuff. A device that stores blood pressure reading results. Do not choose a monitor that measures your blood pressure from your wrist or finger. Follow your health care provider's instructions for how to take your blood pressure. To use this form: Get one reading in the morning (a.m.) before you take any medicines. Get one reading in the evening (p.m.) before supper. Take at least two readings with each blood pressure check. This makes sure the results are correct. Wait 1-2 minutes between measurements. Write down the results in the spaces on this form. Repeat this once a week, or as told by your health care provider. Make a follow-up appointment with your health care provider to discuss the results. Blood pressure log Date: _______________________ a.m. _____________________(1st reading) _____________________(2nd reading) p.m. _____________________(1st reading) _____________________(2nd reading) Date: _______________________ a.m. _____________________(1st reading) _____________________(2nd reading) p.m. _____________________(1st reading) _____________________(2nd reading) Date: _______________________ a.m. _____________________(1st reading) _____________________(2nd reading) p.m. _____________________(1st reading) _____________________(2nd reading) Date: _______________________ a.m. _____________________(1st reading) _____________________(2nd reading) p.m. _____________________(1st reading) _____________________(2nd reading) Date:  _______________________ a.m. _____________________(1st reading) _____________________(2nd reading) p.m. _____________________(1st reading) _____________________(2nd reading) This information is not intended to replace advice given to you by your health care provider. Make sure you discuss any questions you have with your health care provider. Document Revised: 09/21/2020 Document Reviewed: 09/21/2020 Elsevier Patient Education  2024 [employer] Managing Anxiety, Adult After being diagnosed with anxiety, you may be relieved to know why you have felt or behaved a certain way. You may also feel overwhelmed about the treatment ahead and what it will mean for your life. With care and support, you can manage your anxiety. How to manage lifestyle changes Understanding the difference between stress and anxiety Although stress can play a role in anxiety, it is not the same as anxiety. Stress is your body's reaction to life changes and events, both good and bad. Stress is often caused by something external, such as a deadline, test, or competition. It normally goes away after the event has ended and will last just a few hours. But, stress can be ongoing and can lead to more than just stress. Anxiety is caused by something internal, such as imagining a terrible outcome or worrying that something will go wrong that will greatly upset you. Anxiety often does not go away even after the event is over, and it can become a long-term (chronic) worry. Lowering stress and anxiety Talk with your health care provider or a counselor to learn more about lowering anxiety and stress. They may suggest tension-reduction techniques, such as: Music. Spend time creating or listening to music that you enjoy and that inspires you. Mindfulness-based meditation. Practice being aware of your normal breaths while not trying to control your breathing. It can be done while sitting or walking. Centering prayer. Focus on a word, phrase,  or sacred image that means something to you and brings you peace. Deep breathing. Expand your stomach and inhale slowly through your nose. Hold your breath for 3-5 seconds. Then breathe out slowly, letting your stomach muscles relax. Self-talk. Learn to notice and spot thought patterns that lead to anxiety reactions. Change those patterns to thoughts that feel peaceful. Muscle relaxation. Take time to tense muscles and then relax them. Choose a tension-reduction technique that fits your lifestyle and personality. These techniques take time and practice. Set aside 5-15 minutes a day to do them. Specialized therapists can offer counseling and  training in these techniques. The training to help with anxiety may be covered by some insurance plans. Other things you can do to manage stress and anxiety include: Keeping a stress diary. This can help you learn what triggers your reaction and then learn ways to manage your response. Thinking about how you react to certain situations. You may not be able to control everything, but you can control your response. Making time for activities that help you relax and not feeling guilty about spending your time in this way. Doing visual imagery. This involves imagining or creating mental pictures to help you relax. Practicing yoga. Through yoga poses, you can lower tension and relax.   Medicines Medicines for anxiety include: Antidepressant medicines. These are usually prescribed for long-term daily control. Anti-anxiety medicines. These may be added in severe cases, especially when panic attacks occur. When used together, medicines, psychotherapy, and tension-reduction techniques may be the most effective treatment. Relationships Relationships can play a big part in helping you recover. Spend more time connecting with trusted friends and family members. Think about going to couples counseling if you have a partner, taking family education classes, or going to family  therapy. Therapy can help you and others better understand your anxiety. How to recognize changes in your anxiety Everyone responds differently to treatment for anxiety. Recovery from anxiety happens when symptoms lessen and stop interfering with your daily life at home or work. This may mean that you will start to: Have better concentration and focus. Worry will interfere less in your daily thinking. Sleep better. Be less irritable. Have more energy. Have improved memory. Try to recognize when your condition is getting worse. Contact your provider if your symptoms interfere with home or work and you feel like your condition is not improving. Follow these instructions at home: Activity Exercise. Adults should: Exercise for at least 150 minutes each week. The exercise should increase your heart rate and make you sweat (moderate-intensity exercise). Do strengthening exercises at least twice a week. Get the right amount and quality of sleep. Most adults need 7-9 hours of sleep each night. Lifestyle  Eat a healthy diet that includes plenty of vegetables, fruits, whole grains, low-fat dairy products, and lean protein. Do not eat a lot of foods that are high in fats, added sugars, or salt (sodium). Make choices that simplify your life. Do not use any products that contain nicotine or tobacco. These products include cigarettes, chewing tobacco, and vaping devices, such as e-cigarettes. If you need help quitting, ask your provider. Avoid caffeine, alcohol, and certain over-the-counter cold medicines. These may make you feel worse. Ask your pharmacist which medicines to avoid. General instructions Take over-the-counter and prescription medicines only as told by your provider. Keep all follow-up visits. This is to make sure you are managing your anxiety well or if you need more support. Where to find support You can get help and support from: Self-help groups. Online and [employer]. A  trusted spiritual leader. Couples counseling. Family education classes. Family therapy. Where to find more information You may find that joining a support group helps you deal with your anxiety. The following sources can help you find counselors or support groups near you: Mental Health America: mentalhealthamerica.net Anxiety and Depression Association of America (ADAA): adaa.org [employer] Mental Illness (NAMI): nami.org Contact a health care provider if: You have a hard time staying focused or finishing tasks. You spend many hours a day feeling worried about everyday life. You are very tired because  you cannot stop worrying. You start to have headaches or often feel tense. You have chronic nausea or diarrhea. Get help right away if: Your heart feels like it is racing. You have shortness of breath. You have thoughts of hurting yourself or others. Get help right away if you feel like you may hurt yourself or others, or have thoughts about taking your own life. Go to your nearest emergency room or: Call 911. Call the National Suicide Prevention Lifeline at 336-819-2646 or 988. This is open 24 hours a day. Text the Crisis Text Line at (970)120-2836. This information is not intended to replace advice given to you by your health care provider. Make sure you discuss any questions you have with your health care provider. Document Revised: 10/16/2021 Document Reviewed: 04/30/2020 Elsevier Patient Education  2024 [employer] Inc[employer]  The above assessment and management plan was discussed with the patient. The patient verbalized understanding of and has agreed to the management plan. Patient is aware to call the clinic if they develop any new symptoms or if symptoms persist or worsen. Patient is aware when to return to the clinic for a follow-up visit. Patient educated on when it is appropriate to go to the emergency department.   Hershell Brandl [place] Thompson, DNP [facility] [address] [place], [place] [address] [phone]

## 2023-12-01 ENCOUNTER — Ambulatory Visit: Payer: Self-pay | Admitting: Nurse Practitioner

## 2023-12-01 ENCOUNTER — Encounter: Payer: Self-pay | Admitting: Nurse Practitioner

## 2023-12-01 VITALS — BP 101/60 | HR 73 | Temp 97.5°F | Ht 61.5 in | Wt 167.8 lb

## 2023-12-01 DIAGNOSIS — I10 Essential (primary) hypertension: Secondary | ICD-10-CM | POA: Diagnosis not present

## 2023-12-01 DIAGNOSIS — F5105 Insomnia due to other mental disorder: Secondary | ICD-10-CM | POA: Diagnosis not present

## 2023-12-01 DIAGNOSIS — F332 Major depressive disorder, recurrent severe without psychotic features: Secondary | ICD-10-CM

## 2023-12-01 DIAGNOSIS — F99 Mental disorder, not otherwise specified: Secondary | ICD-10-CM

## 2023-12-01 DIAGNOSIS — E538 Deficiency of other specified B group vitamins: Secondary | ICD-10-CM | POA: Diagnosis not present

## 2023-12-01 DIAGNOSIS — F330 Major depressive disorder, recurrent, mild: Secondary | ICD-10-CM

## 2023-12-01 MED ORDER — ESCITALOPRAM OXALATE 20 MG PO TABS: 20.0000 mg | ORAL_TABLET | Freq: Every day | ORAL | 0 refills | Status: AC

## 2023-12-01 MED ORDER — TRAZODONE HCL 50 MG PO TABS: ORAL_TABLET | ORAL | 0 refills | Status: AC

## 2023-12-01 MED ORDER — LISINOPRIL-HYDROCHLOROTHIAZIDE 20-12.5 MG PO TABS: 1.0000 | ORAL_TABLET | Freq: Every day | ORAL | 0 refills | Status: AC

## 2023-12-01 NOTE — Patient Instructions (Signed)
 Need size for IM injection Deltoid (upper arm) 22-25 gauge 1-1.5 inch

## 2024-02-05 ENCOUNTER — Ambulatory Visit: Payer: Medicare HMO

## 2024-02-06 ENCOUNTER — Other Ambulatory Visit: Payer: Self-pay | Admitting: Hematology and Oncology

## 2024-02-06 ENCOUNTER — Inpatient Hospital Stay: Attending: Hematology and Oncology

## 2024-02-06 DIAGNOSIS — D509 Iron deficiency anemia, unspecified: Secondary | ICD-10-CM

## 2024-02-06 DIAGNOSIS — E538 Deficiency of other specified B group vitamins: Secondary | ICD-10-CM | POA: Diagnosis present

## 2024-02-06 DIAGNOSIS — K912 Postsurgical malabsorption, not elsewhere classified: Secondary | ICD-10-CM | POA: Insufficient documentation

## 2024-02-06 DIAGNOSIS — D508 Other iron deficiency anemias: Secondary | ICD-10-CM | POA: Diagnosis not present

## 2024-02-06 LAB — CBC WITH DIFFERENTIAL (CANCER CENTER ONLY)
Abs Immature Granulocytes: 0.01 K/uL (ref 0.00–0.07)
Basophils Absolute: 0 K/uL (ref 0.0–0.1)
Basophils Relative: 1 %
Eosinophils Absolute: 0.1 K/uL (ref 0.0–0.5)
Eosinophils Relative: 3 %
HCT: 35.7 % — ABNORMAL LOW (ref 36.0–46.0)
Hemoglobin: 12.1 g/dL (ref 12.0–15.0)
Immature Granulocytes: 0 %
Lymphocytes Relative: 21 %
Lymphs Abs: 1 K/uL (ref 0.7–4.0)
MCH: 32.6 pg (ref 26.0–34.0)
MCHC: 33.9 g/dL (ref 30.0–36.0)
MCV: 96.2 fL (ref 80.0–100.0)
Monocytes Absolute: 0.4 K/uL (ref 0.1–1.0)
Monocytes Relative: 9 %
Neutro Abs: 3 K/uL (ref 1.7–7.7)
Neutrophils Relative %: 66 %
Platelet Count: 306 K/uL (ref 150–400)
RBC: 3.71 MIL/uL — ABNORMAL LOW (ref 3.87–5.11)
RDW: 12.8 % (ref 11.5–15.5)
WBC Count: 4.5 K/uL (ref 4.0–10.5)
nRBC: 0 % (ref 0.0–0.2)

## 2024-02-06 LAB — CMP (CANCER CENTER ONLY)
ALT: 9 U/L (ref 0–44)
AST: 19 U/L (ref 15–41)
Albumin: 4.1 g/dL (ref 3.5–5.0)
Alkaline Phosphatase: 79 U/L (ref 38–126)
Anion gap: 9 (ref 5–15)
BUN: 20 mg/dL (ref 8–23)
CO2: 31 mmol/L (ref 22–32)
Calcium: 9.4 mg/dL (ref 8.9–10.3)
Chloride: 100 mmol/L (ref 98–111)
Creatinine: 0.72 mg/dL (ref 0.44–1.00)
GFR, Estimated: >60 mL/min
Glucose, Bld: 107 mg/dL — ABNORMAL HIGH (ref 70–99)
Potassium: 4 mmol/L (ref 3.5–5.1)
Sodium: 140 mmol/L (ref 135–145)
Total Bilirubin: 0.3 mg/dL (ref 0.0–1.2)
Total Protein: 7.1 g/dL (ref 6.5–8.1)

## 2024-02-06 LAB — IRON AND IRON BINDING CAPACITY (CC-WL,HP ONLY)
Iron: 47 ug/dL (ref 28–170)
Saturation Ratios: 10 % — ABNORMAL LOW (ref 10.4–31.8)
TIBC: 448 ug/dL (ref 250–450)
UIBC: 402 ug/dL

## 2024-02-06 LAB — RETIC PANEL
Immature Retic Fract: 9.4 % (ref 2.3–15.9)
RBC.: 3.69 MIL/uL — ABNORMAL LOW (ref 3.87–5.11)
Retic Count, Absolute: 67.2 K/uL (ref 19.0–186.0)
Retic Ct Pct: 1.8 % (ref 0.4–3.1)
Reticulocyte Hemoglobin: 35.9 pg

## 2024-02-06 LAB — FERRITIN: Ferritin: 26 ng/mL (ref 11–307)

## 2024-02-06 LAB — VITAMIN B12: Vitamin B-12: 659 pg/mL (ref 180–914)

## 2024-02-08 LAB — METHYLMALONIC ACID, SERUM: Methylmalonic Acid, Quantitative: 135 nmol/L (ref 0–378)

## 2024-02-10 ENCOUNTER — Ambulatory Visit: Payer: Self-pay

## 2024-02-11 ENCOUNTER — Other Ambulatory Visit: Payer: Self-pay | Admitting: Nurse Practitioner

## 2024-02-11 DIAGNOSIS — M79604 Pain in right leg: Secondary | ICD-10-CM

## 2024-02-16 ENCOUNTER — Encounter: Payer: Self-pay | Admitting: *Deleted

## 2024-03-30 ENCOUNTER — Ambulatory Visit: Admitting: Family Medicine

## 2024-03-30 ENCOUNTER — Ambulatory Visit: Admitting: Nurse Practitioner

## 2024-05-07 ENCOUNTER — Inpatient Hospital Stay

## 2024-05-07 ENCOUNTER — Inpatient Hospital Stay: Admitting: Hematology and Oncology
